# Patient Record
Sex: Female | Born: 1960
Health system: Southern US, Community
[De-identification: ages and names within clinical notes are randomized; demographics above are authoritative.]

## PROBLEM LIST (undated history)

## (undated) DIAGNOSIS — Z5181 Encounter for therapeutic drug level monitoring: Secondary | ICD-10-CM

## (undated) DIAGNOSIS — G43909 Migraine, unspecified, not intractable, without status migrainosus: Secondary | ICD-10-CM

## (undated) DIAGNOSIS — B0229 Other postherpetic nervous system involvement: Secondary | ICD-10-CM

## (undated) DIAGNOSIS — R002 Palpitations: Secondary | ICD-10-CM

## (undated) DIAGNOSIS — H9192 Unspecified hearing loss, left ear: Secondary | ICD-10-CM

## (undated) DIAGNOSIS — I63441 Cerebral infarction due to embolism of right cerebellar artery: Secondary | ICD-10-CM

## (undated) DIAGNOSIS — R0609 Other forms of dyspnea: Secondary | ICD-10-CM

## (undated) DIAGNOSIS — G43109 Migraine with aura, not intractable, without status migrainosus: Secondary | ICD-10-CM

## (undated) DIAGNOSIS — R42 Dizziness and giddiness: Secondary | ICD-10-CM

## (undated) DIAGNOSIS — R0789 Other chest pain: Principal | ICD-10-CM

## (undated) DIAGNOSIS — D6861 Antiphospholipid syndrome: Secondary | ICD-10-CM

## (undated) DIAGNOSIS — Z7409 Other reduced mobility: Secondary | ICD-10-CM

## (undated) DIAGNOSIS — Z7901 Long term (current) use of anticoagulants: Secondary | ICD-10-CM

## (undated) HISTORY — DX: Migraine with aura, not intractable, without status migrainosus: G43.109

## (undated) HISTORY — PX: COLPOSCOPY: SHX161

## (undated) HISTORY — DX: Dizziness and giddiness: R42

## (undated) HISTORY — PX: TONSILLECTOMY: SUR1361

## (undated) HISTORY — DX: Antiphospholipid syndrome: D68.61

## (undated) HISTORY — DX: Other reduced mobility: Z74.09

## (undated) HISTORY — DX: Migraine, unspecified, not intractable, without status migrainosus: G43.909

## (undated) HISTORY — DX: Cerebral infarction due to embolism of right cerebellar artery: I63.441

## (undated) HISTORY — DX: Encounter for therapeutic drug level monitoring: Z51.81

## (undated) HISTORY — DX: Unspecified hearing loss, left ear: H91.92

## (undated) HISTORY — DX: Long term (current) use of anticoagulants: Z79.01

## (undated) HISTORY — DX: Other forms of dyspnea: R06.09

## (undated) HISTORY — DX: Palpitations: R00.2

## (undated) HISTORY — DX: Other chest pain: R07.89

## (undated) HISTORY — DX: Other postherpetic nervous system involvement: B02.29

---

## 2014-10-26 ENCOUNTER — Ambulatory Visit: Payer: Self-pay | Admitting: Cardiology

## 2014-10-28 ENCOUNTER — Telehealth: Payer: Self-pay | Admitting: Cardiology

## 2014-10-28 ENCOUNTER — Encounter: Payer: Self-pay | Admitting: Cardiology

## 2014-11-17 NOTE — Telephone Encounter (Signed)
ERROR

## 2014-11-23 ENCOUNTER — Encounter: Payer: Self-pay | Admitting: *Deleted

## 2014-11-25 ENCOUNTER — Encounter: Payer: Self-pay | Admitting: Cardiology

## 2014-11-25 ENCOUNTER — Ambulatory Visit (INDEPENDENT_AMBULATORY_CARE_PROVIDER_SITE_OTHER): Payer: 59 | Admitting: Cardiology

## 2014-11-25 VITALS — BP 124/82 | HR 80 | Ht 65.0 in | Wt 112.8 lb

## 2014-11-25 DIAGNOSIS — R0609 Other forms of dyspnea: Secondary | ICD-10-CM

## 2014-11-25 DIAGNOSIS — R0600 Dyspnea, unspecified: Secondary | ICD-10-CM

## 2014-11-25 DIAGNOSIS — R079 Chest pain, unspecified: Secondary | ICD-10-CM | POA: Insufficient documentation

## 2014-11-25 DIAGNOSIS — R002 Palpitations: Secondary | ICD-10-CM

## 2014-11-25 DIAGNOSIS — R0789 Other chest pain: Secondary | ICD-10-CM

## 2014-11-25 HISTORY — DX: Other forms of dyspnea: R06.09

## 2014-11-25 HISTORY — DX: Dyspnea, unspecified: R06.00

## 2014-11-25 HISTORY — DX: Palpitations: R00.2

## 2014-11-25 HISTORY — DX: Chest pain, unspecified: R07.9

## 2014-11-25 NOTE — Patient Instructions (Signed)
Your physician has recommended that you wear an event monitor. Event monitors are medical devices that record the heart's electrical activity. Doctors most often us these monitors to diagnose arrhythmias. Arrhythmias are problems with the speed or rhythm of the heartbeat. The monitor is a small, portable device. You can wear one while you do your normal daily activities. This is usually used to diagnose what is causing palpitations/syncope (passing out).  Your physician has requested that you have an echocardiogram. Echocardiography is a painless test that uses sound waves to create images of your heart. It provides your doctor with information about the size and shape of your heart and how well your heart's chambers and valves are working. This procedure takes approximately one hour. There are no restrictions for this procedure Your physician has requested that you have a stress echocardiogram. For further information please visit [email or web address]. Please follow instruction sheet as given.  Your physician recommends that you continue on your current medications as directed. Please refer to the Current Medication list given to you today.  Follow up as needed

## 2014-11-25 NOTE — Progress Notes (Signed)
Cardiology Office Note   Date:  11/25/2014   ID:  Martha Burke, DOB 25-Jan-1961, MRN [id number]  PCP:  Frederich Chick, MD  Cardiologist:    Cassell Clement, MD   No chief complaint on file.     History of Present Illness: Martha Burke is a 54 y.o. female who presents for evaluation of intermittent palpitations, chest discomfort, and shortness of breath.  Seen at the request of Dr. Shirlean Mylar.  The patient does not have any history of known cardiac disease.  Patient had an echocardiogram in 2000 when she was pregnant and living in [place].  That time she had mildly enlarged right heart chambers and normal left ventricular function and mild mitral insufficiency and mild-to-moderate tricuspid regurgitation. For the past year she has been experiencing some palpitations.  Typically these occur in the evening.  They do not occur every night.  They often occur when she lies down to go to bed at night.  She describes them as a regular rhythm with intermittent premature beats.  They frequently make her feel like she needs to cough.  She has cut back on her caffeine without much effect.  Does not smoke.  She drinks very little alcohol. In addition, she has had some occasional left-sided chest discomfort and a localized area in the left upper anterior chest with exertion.  Discomfort subsides if she stops and presses on the area with the palm of her hand.  He has also noted some shortness of breath with activity such as climbing stairs. The patient has a family history of premature coronary disease.  Her father died at age 50.  He had a heart attack at age 60 which occurred during a cardiac catheterization.  Her father had CABG at age 31.  The patient has a younger brother with hypertension.  His mother is alive at 21 and required cardioversion for an irregular heartbeat last year. Social history reveals that the patient is married.  She has 4 children.  Youngest child is a juvenile  diabetic and the patient herself has gone on a nearly vegan diet to help her daughter control her diabetes.  Patient works as an Retail buyer at [employer].  She had homeschooled her previous 3 children and her youngest child is now a Consulting civil engineer in high school at [employer].    Past Medical History  Diagnosis Date  . Palpitations   . Migraine headache     Past Surgical History  Procedure Laterality Date  . Tonsillectomy       Current Outpatient Prescriptions  Medication Sig Dispense Refill  . acetaminophen (TYLENOL) 500 MG tablet Take 500 mg by mouth every 6 (six) hours as needed.    Marland Kitchen ibuprofen (ADVIL,MOTRIN) 200 MG tablet Take 200 mg by mouth every 6 (six) hours as needed.     No current facility-administered medications for this visit.    Allergies:   Review of patient's allergies indicates no known allergies.    Social History:  The patient  reports that she has never smoked. She does not have any smokeless tobacco history on file.   Family History:  The patient's family history includes Atrial fibrillation in her mother; Diabetes in her daughter; Heart attack in her father; Hypertension in her father.    ROS:  Please see the history of present illness.   Otherwise, review of systems are positive for none.   All other systems are reviewed and negative.    PHYSICAL EXAM:  VS:  BP 124/82 mmHg  Pulse 80  Ht 5\' 5"  (1.651 m)  Wt 112 lb 12.8 oz (51.166 kg)  BMI 18.77 kg/m2 , BMI Body mass index is 18.77 kg/(m^2). GEN: Well nourished, well developed, in no acute distress HEENT: normal Neck: no JVD, carotid bruits, or masses Cardiac: RRR; no murmurs, rubs, or gallops,no edema  Respiratory:  clear to auscultation bilaterally, normal work of breathing GI: soft, nontender, nondistended, + BS MS: no deformity or atrophy Skin: warm and dry, no rash Neuro:  Strength and sensation are intact Psych: euthymic mood, full affect   EKG:  EKG is ordered today. The ekg  ordered today demonstrates normal sinus rhythm and no ischemic changes.  No premature beats are seen.  Tracing was compared with the previous EKG of 10/19/14 and is unchanged   Recent Labs: No results found for requested labs within last 365 days.    Lipid Panel No results found for: CHOL, TRIG, HDL, CHOLHDL, VLDL, LDLCALC, LDLDIRECT    Wt Readings from Last 3 Encounters:  11/25/14 112 lb 12.8 oz (51.166 kg)      Other studies Reviewed: Additional studies/ records that were reviewed today include: Lab work sent over from Dr. Hyman HopesWebb. Review of the above records demonstrates: Normal electrolytes and blood sugar.   ASSESSMENT AND PLAN:  1.  Intermittent palpitations, increasing in frequency, occurring mainly in the evening 2.  Atypical chest pain 3.  Dyspnea on exertion   Current medicines are reviewed at length with the patient today.  The patient does not have concerns regarding medicines.  Patient is not currently on any cardiac medications  The following changes have been made:  no change  Labs/ tests ordered today include:  Orders Placed This Encounter  Procedures  . EKG 12-Lead  . Cardiac event monitor  . 2D Echocardiogram without contrast  . Echocardiogram stress test without contrast   Plan: We will obtain a two-dimensional echocardiogram to look at her heart valves.  We will obtain a stress echocardiogram to evaluate her exertional chest discomfort.  To evaluate her intermittent palpitations we will have her wear an event monitor for 30 days.  No new medications prescribed at this point.  She should continue to limit caffeine. Any thanks for the opportunity to see this pleasant woman with you and we will be in touch regarding the results of her studies.  Recheck here on a when necessary basis.    Signed, Cassell Clementhomas Jaelynn Pozo, MD  11/25/2014 5:20 PM    [facility] [address], [place], [place]  [address] Phone: [phone]) [phone]; Fax: [phone]

## 2014-11-26 ENCOUNTER — Encounter: Payer: Self-pay | Admitting: *Deleted

## 2014-11-26 ENCOUNTER — Encounter (INDEPENDENT_AMBULATORY_CARE_PROVIDER_SITE_OTHER): Payer: 59

## 2014-11-26 DIAGNOSIS — R0609 Other forms of dyspnea: Secondary | ICD-10-CM

## 2014-11-26 DIAGNOSIS — R0600 Dyspnea, unspecified: Secondary | ICD-10-CM

## 2014-11-26 DIAGNOSIS — R002 Palpitations: Secondary | ICD-10-CM | POA: Diagnosis not present

## 2014-11-26 NOTE — Progress Notes (Signed)
Patient ID: Jolly MangoMarsha Burke, female   DOB: Nov 10, 1960, 54 y.o.   MRN: [id number] [employer] 30 day cardiac event monitor applied to patient.

## 2014-12-04 ENCOUNTER — Ambulatory Visit (HOSPITAL_COMMUNITY): Payer: 59 | Attending: Cardiology

## 2014-12-04 ENCOUNTER — Ambulatory Visit (HOSPITAL_BASED_OUTPATIENT_CLINIC_OR_DEPARTMENT_OTHER): Payer: 59 | Admitting: Cardiology

## 2014-12-04 DIAGNOSIS — R0600 Dyspnea, unspecified: Secondary | ICD-10-CM

## 2014-12-04 DIAGNOSIS — R0789 Other chest pain: Secondary | ICD-10-CM | POA: Insufficient documentation

## 2014-12-04 DIAGNOSIS — R0609 Other forms of dyspnea: Secondary | ICD-10-CM

## 2014-12-04 DIAGNOSIS — R079 Chest pain, unspecified: Secondary | ICD-10-CM

## 2014-12-04 DIAGNOSIS — R002 Palpitations: Secondary | ICD-10-CM | POA: Insufficient documentation

## 2014-12-04 NOTE — Progress Notes (Signed)
Stress echo performed. 

## 2014-12-04 NOTE — Progress Notes (Signed)
Echo performed. 

## 2014-12-07 ENCOUNTER — Telehealth: Payer: Self-pay | Admitting: Cardiology

## 2014-12-07 NOTE — Telephone Encounter (Signed)
-----   Message from Cassell Clementhomas Brackbill, MD sent at 12/06/2014  5:39 PM EDT ----- Please report. Stress echo was normal.  No blockage. Good LV function.

## 2014-12-07 NOTE — Telephone Encounter (Signed)
New Msg        Pt states she is returning call from today.    Please return call, its ok to leave detailed msgs.   Pt will be unavailable for the next 2 hrs.

## 2014-12-07 NOTE — Telephone Encounter (Signed)
Left message to call back  

## 2014-12-11 NOTE — Telephone Encounter (Signed)
Advised patient

## 2014-12-11 NOTE — Telephone Encounter (Signed)
-----   Message from Cassell Clementhomas Brackbill, MD sent at 12/08/2014  8:47 PM EDT ----- Please report.  The echo showed normal LV function. There was mild mitral regurgitation, not significant or causing any symptoms. Continue full activity.

## 2015-01-01 ENCOUNTER — Telehealth: Payer: Self-pay | Admitting: *Deleted

## 2015-01-01 NOTE — Telephone Encounter (Signed)
Left message to call back    Dr. Patty SermonsBrackbill reviewed event monitor  Interpretation: NSR, occasional PAC's. No SVT, atrial fibrillation, or dangerous arrhythmia

## 2015-01-06 NOTE — Telephone Encounter (Signed)
Called patient about event monitor results. Per Dr. Lucienne MinksBracbill, NSR, occasional PAC's. NO SVT, artrial fibrillation, or dangerous arrhythmia. Patient verbalized understanding.

## 2016-12-26 ENCOUNTER — Other Ambulatory Visit: Payer: Self-pay | Admitting: *Deleted

## 2016-12-26 ENCOUNTER — Other Ambulatory Visit: Payer: Self-pay | Admitting: Family Medicine

## 2016-12-26 ENCOUNTER — Ambulatory Visit
Admission: RE | Admit: 2016-12-26 | Discharge: 2016-12-26 | Disposition: A | Discharge status: Home / Self Care | Payer: 59 | Source: Ambulatory Visit | Attending: Family Medicine | Admitting: Family Medicine

## 2016-12-26 DIAGNOSIS — R05 Cough: Secondary | ICD-10-CM

## 2016-12-26 DIAGNOSIS — R059 Cough, unspecified: Secondary | ICD-10-CM

## 2017-03-27 DIAGNOSIS — R42 Dizziness and giddiness: Secondary | ICD-10-CM | POA: Insufficient documentation

## 2017-03-27 HISTORY — DX: Dizziness and giddiness: R42

## 2017-03-28 DIAGNOSIS — I63441 Cerebral infarction due to embolism of right cerebellar artery: Secondary | ICD-10-CM | POA: Insufficient documentation

## 2017-03-28 HISTORY — DX: Cerebral infarction due to embolism of right cerebellar artery: I63.441

## 2017-04-02 DIAGNOSIS — Z789 Other specified health status: Secondary | ICD-10-CM | POA: Insufficient documentation

## 2017-04-02 DIAGNOSIS — Z7409 Other reduced mobility: Secondary | ICD-10-CM | POA: Insufficient documentation

## 2017-04-02 HISTORY — DX: Other reduced mobility: Z74.09

## 2017-04-02 HISTORY — DX: Other specified health status: Z78.9

## 2017-04-09 DIAGNOSIS — D6861 Antiphospholipid syndrome: Secondary | ICD-10-CM

## 2017-04-09 DIAGNOSIS — Z7901 Long term (current) use of anticoagulants: Secondary | ICD-10-CM

## 2017-04-09 DIAGNOSIS — Z5181 Encounter for therapeutic drug level monitoring: Secondary | ICD-10-CM

## 2017-04-09 HISTORY — DX: Antiphospholipid syndrome: D68.61

## 2017-04-09 HISTORY — DX: Encounter for therapeutic drug level monitoring: Z51.81

## 2017-04-09 HISTORY — DX: Long term (current) use of anticoagulants: Z79.01

## 2017-04-12 ENCOUNTER — Telehealth: Payer: Self-pay | Admitting: *Deleted

## 2017-04-12 ENCOUNTER — Encounter: Payer: Self-pay | Admitting: Neurology

## 2017-04-12 ENCOUNTER — Encounter: Payer: Self-pay | Admitting: *Deleted

## 2017-04-12 DIAGNOSIS — R079 Chest pain, unspecified: Secondary | ICD-10-CM

## 2017-04-12 DIAGNOSIS — R0789 Other chest pain: Principal | ICD-10-CM

## 2017-04-12 NOTE — Telephone Encounter (Signed)
Referral request from Shirlean Mylarcarol Webb, MD of [facility] sent to scheduling

## 2017-04-13 ENCOUNTER — Telehealth: Payer: Self-pay | Admitting: Physician Assistant

## 2017-04-13 ENCOUNTER — Ambulatory Visit: Payer: Commercial Managed Care - PPO | Admitting: Cardiology

## 2017-05-02 ENCOUNTER — Ambulatory Visit (INDEPENDENT_AMBULATORY_CARE_PROVIDER_SITE_OTHER): Payer: Commercial Managed Care - PPO | Admitting: Physician Assistant

## 2017-05-02 ENCOUNTER — Encounter: Payer: Self-pay | Admitting: Physician Assistant

## 2017-05-02 VITALS — BP 137/87 | HR 64 | Ht 65.0 in | Wt 117.6 lb

## 2017-05-02 DIAGNOSIS — R002 Palpitations: Secondary | ICD-10-CM

## 2017-05-02 DIAGNOSIS — I491 Atrial premature depolarization: Secondary | ICD-10-CM

## 2017-05-02 DIAGNOSIS — R760 Raised antibody titer: Secondary | ICD-10-CM

## 2017-05-02 DIAGNOSIS — I6340 Cerebral infarction due to embolism of unspecified cerebral artery: Secondary | ICD-10-CM

## 2017-05-02 NOTE — Progress Notes (Signed)
Cardiology Office Note    Date:  05/03/2017   ID:  Martha MangoMarsha Burke, DOB November 14, 1960, MRN [id number]  PCP:  Shirlean MylarWebb, Carol, MD  Cardiologist:  Previously Dr. Patty SermonsBrackbill, plan to setup with Dr. Duke Salviaandolph Hematologist: Dr. Ivor ReiningGeorge Sanders Neurologist Dr. Quinn PlowmanAmy Kearns  Chief Complaint  Patient presents with  . Hospitalization Follow-up    stroke, pt denied chest pain and SOB. Plan to set up with Dr. Duke Salviaandolph    History of Present Illness:  Martha Burke is a 56 y.o. female with PMH of intermittent palpitation, Chest discomfort or shortness breath he was previously seen by Dr. Patty SermonsBrackbill on 11/07/2014. She had a previous echocardiogram in 2000 which showed mild-to-moderate tricuspid regurgitation, normal LV function and mildly enlarged right heart chamber. She has significant family history of coronary artery disease with her father had CABG at age 55. Last echocardiogram obtained on 12/04/2014 showed EF 60-65%, mild MR. She also had a stress echocardiogram on 12/04/2014 that was normal. She were a 30 day day monitor at the time, this showed normal sinus rhythm with PACs but no significant arrhythmia or atrial fibrillation.  She was recently treated for pharyngitis, later developed shingles. She presented to the hospital on 03/27/2017 with leg weakness, imbalance, slurring of speech and intractable vomiting. CT of the head was negative, MRI of the brain however showed subacute infarct in the right superior cerebellum, small amount of acute infarct in the cerebellar vermis and left superior cerebellum, probable embolic infarct. Her aspirin was discontinued and she was switched to eliquis and eventually Coumadin bridging with Lovenox. INR goal was 2.5-3 according to hematologist. She had echocardiogram which was normal. Negative bubble study. ANA-positive titer 1:160, antiphospholipid antibody positive. Hemoglobin A1c was 5.2. According to hematologist, positive antiphospholipid antibodies could be secondary to  recent herpes infection. Cardiology was consulted who recommended follow-up as outpatient if she developed palpitations to rule out paroxysmal atrial fibrillation.  Since her discharge, she has not noticed any significant recurrent palpitation. She however has been noticing upper left arm pain which is localized. It is worse when she is laying on her side, however not affected by palpation and arm rotation. She has very good radial pulse on that side. She did not have any recent invasive catheter placed on the arm. At this point it is very atypical and I would not recommend any additional workup. I recommended she continue observation in this time. As far as her recent stroke, although we can potentially consider heart monitor, however since she is already on the Coumadin, it will not change her overall treatment plan. [facility]. She does still have some degree of imbalance issue. I did a full neuro exam on her, does not appears to have obvious focal deficits.    Past Medical History:  Diagnosis Date  . APS (antiphospholipid syndrome) (HCC) 04/09/2017  . Cerebrovascular accident (CVA) due to embolism of right cerebellar artery (HCC) 03/28/2017  . Chest pain of unknown etiology 11/25/2014  . Decreased hearing, left   . Dyspnea on exertion 11/25/2014  . Encounter for therapeutic drug monitoring 04/09/2017  . Impaired mobility and activities of daily living 04/02/2017  . Intermittent palpitations 11/25/2014  . Long term (current) use of anticoagulants 04/09/2017  . Migraine aura without headache   . Migraine headache   . Palpitations   . Palpitations   . Post herpetic neuralgia   . Vertigo 03/27/2017    Past Surgical History:  Procedure Laterality Date  .  TONSILLECTOMY      Current Medications: Outpatient Medications Prior to Visit  Medication Sig Dispense Refill  . acetaminophen (TYLENOL) 500 MG tablet Take 500 mg by mouth every 6 (six) hours as needed.      Marland Kitchen ibuprofen (ADVIL,MOTRIN) 200 MG tablet Take 200 mg by mouth every 6 (six) hours as needed.     No facility-administered medications prior to visit.      Allergies:   Celery oil; Peanut oil; and Tomato   Social History   Social History  . Marital status: Married    Spouse name: N/A  . Number of children: N/A  . Years of education: N/A   Social History Main Topics  . Smoking status: Never Smoker  . Smokeless tobacco: Never Used  . Alcohol use No  . Drug use: No  . Sexual activity: Not Asked   Other Topics Concern  . None   Social History Narrative  . None     Family History:  The patient's family history includes Atrial fibrillation in her mother; CAD in her father; Diabetes in her daughter; Heart attack in her paternal grandfather and paternal grandmother; Heart attack (age of onset: 41) in her father; Hypertension in her brother, brother, and father; Irregular heart beat in her mother; Stroke in her maternal grandmother.   ROS:   Please see the history of present illness.    ROS All other systems reviewed and are negative.   PHYSICAL EXAM:   VS:  BP 137/87   Pulse 64   Ht 5\' 5"  (1.651 m)   Wt 117 lb 9.6 oz (53.3 kg)   BMI 19.57 kg/m    GEN: Well nourished, well developed, in no acute distress  HEENT: normal  Neck: no JVD, carotid bruits, or masses Cardiac: RRR; no murmurs, rubs, or gallops,no edema  Respiratory:  clear to auscultation bilaterally, normal work of breathing GI: soft, nontender, nondistended, + BS MS: no deformity or atrophy  Skin: warm and dry, no rash Neuro:  Alert and Oriented x 3, Strength and sensation are intact Psych: euthymic mood, full affect  Wt Readings from Last 3 Encounters:  05/02/17 117 lb 9.6 oz (53.3 kg)  11/25/14 112 lb 12.8 oz (51.2 kg)      Studies/Labs Reviewed:   EKG:  EKG is ordered today.  The ekg ordered today demonstrates Normal sinus rhythm, poor R wave progression in anterior leads  Recent Labs: No results  found for requested labs within last 8760 hours.   Lipid Panel No results found for: CHOL, TRIG, HDL, CHOLHDL, VLDL, LDLCALC, LDLDIRECT  Additional studies/ records that were reviewed today include:   Echo 12/04/2014 LV EF: 60% -  65%  ------------------------------------------------------------------- Indications:   (R06.09).  ------------------------------------------------------------------- History:  PMH: Palpitations. Acquired from the patient and from the patient&'s chart. Chest pain. Dyspnea.  ------------------------------------------------------------------- Study Conclusions  - Left ventricle: The cavity size was normal. Wall thickness was normal. Systolic function was normal. The estimated ejection fraction was in the range of 60% to 65%. Wall motion was normal; there were no regional wall motion abnormalities. Left ventricular diastolic function parameters were normal. - Mitral valve: There was mild regurgitation.   Stress echo 12/04/2014 Study Conclusions  - Stress ECG conclusions: There were no stress arrhythmias or conduction abnormalities. The stress ECG was negative for ischemia. - Staged echo: There was no echocardiographic evidence for stress-induced ischemia.  Impressions:  - Normal stress echo     ASSESSMENT:    1. Cerebrovascular accident (CVA)  due to embolism of cerebral artery (HCC)   2. Palpitation   3. Antiphospholipid antibody positive   4. PAC (premature atrial contraction)      PLAN:  In order of problems listed above:  1. CVA: Recent CVA, no focal deficits on physical exam. Antiphospholipid antibody positive.  2. Palpitation: No obvious evidence of atrial fibrillation. She is currently on Coumadin after recent stroke, a heart monitor would not change her treatment plan.  3. Antiphospholipid antibody positive: could be related to recent infection    Medication Adjustments/Labs and Tests Ordered: Current  medicines are reviewed at length with the patient today.  Concerns regarding medicines are outlined above.  Medication changes, Labs and Tests ordered today are listed in the Patient Instructions below. Patient Instructions  Medication Instructions:   No change  Labwork:   none  Testing/Procedures:  none  Follow-Up:  With Dr. Duke Salvia in 3-4 months  If your arm pain becomes more exertional in nature, please let us know, and we will see you sooner than scheduled.   If you need a refill on your cardiac medications before your next appointment, please call your pharmacy.      Ramond Dial, [place]  05/03/2017 11:58 PM    [facility] [address] [place], [place], [place]  [address] Phone: [phone]; Fax: [phone]

## 2017-05-02 NOTE — Patient Instructions (Signed)
Medication Instructions:   No change  Labwork:   none  Testing/Procedures:  none  Follow-Up:  With Dr. Duke Salviaandolph in 3-4 months  If your arm pain becomes more exertional in nature, please let us know, and we will see you sooner than scheduled.   If you need a refill on your cardiac medications before your next appointment, please call your pharmacy.

## 2017-05-03 ENCOUNTER — Encounter: Payer: Self-pay | Admitting: Physician Assistant

## 2017-07-05 ENCOUNTER — Ambulatory Visit (INDEPENDENT_AMBULATORY_CARE_PROVIDER_SITE_OTHER): Payer: BLUE CROSS/BLUE SHIELD | Admitting: Neurology

## 2017-07-05 ENCOUNTER — Encounter: Payer: Self-pay | Admitting: Neurology

## 2017-07-05 VITALS — BP 118/94 | HR 63 | Ht 65.0 in | Wt 118.4 lb

## 2017-07-05 DIAGNOSIS — B0229 Other postherpetic nervous system involvement: Secondary | ICD-10-CM

## 2017-07-05 DIAGNOSIS — D6861 Antiphospholipid syndrome: Secondary | ICD-10-CM

## 2017-07-05 DIAGNOSIS — I6349 Cerebral infarction due to embolism of other cerebral artery: Secondary | ICD-10-CM | POA: Diagnosis not present

## 2017-07-05 NOTE — Patient Instructions (Signed)
1.  Continue Coumadin.  If there is any discussion about stopping Coumadin, then I would like to get a 30 day cardiac event monitor 2.  Continue pravastatin 3.  No limitations 4.  Follow up in 3 months.

## 2017-07-05 NOTE — Progress Notes (Signed)
NEUROLOGY CONSULTATION NOTE  Martha Burke MRN: [id number] DOB: 06/04/1961  Referring provider: Dr. Hyman Hopes Primary care provider: Dr. Hyman Hopes  Reason for consult:  stroke  HISTORY OF PRESENT ILLNESS: Martha Burke is a 56 year old right-handed female who presents for stroke.  History supplemented by hospital admission records.  CT and MRI reports from admission reviewed.  In May 2018, she developed zoster involving the left side/posterior neck and with dysesthesia in the upper arm/bicep.  She was treated with Valtrex and steroids.  She was admitted to [facility] from 03/27/17 to 04/02/17 for stroke.  She presented with disequilibrium, ataxia, right sided dysmetria and left sided weakness.  CT of head was negative but MRI of brain showed subacute infarct in the right superior cerebellum as well as small acute infarct in the cerebellar vermis and left superior cerebellum.  As stroke appeared embolic, she was started on Eliquis for possible paroxysmal atrial fibrillation.  CTA of the head showed normal variant of the circle of Willis with no significant proximal stenosis or branch vessel occlusion.  CTA of neck showed normal variant occluded and aberrant right subclavian artery and fetal type right posterior cerebral artery but no significant focal stenosis or occlusion of the carotid or vertebral arteries.  TTE demonstrated negative bubble study with normal left ventricular size and systolic function with no appreciable segmental abnormality.  EKG was within normal limits.  Cardiology recommended outpatient workup only if she developed palpitations.  ANA was positive with titer 1:160 and antiphospholipid antibodies were positive.  She was also found to have elevated beta-2 glycoprotein IgG of 104 and anticardiolipin IgG of 56.  Hematology was consulted and stated that the findings could be secondary to the herpes infection in May.  She was diagnosed with embolic stroke secondary to  antiphospholipid antibody syndrome and was switched from Eliquis to Coumadin with Lovenox bridge.  Hgb A1c was 5.2.  She was also started on pravastatin 80mg .   Since the stroke, she feels more emotional.  She still has burning sensation in the left biceps.  PAST MEDICAL HISTORY: Past Medical History:  Diagnosis Date  . APS (antiphospholipid syndrome) (HCC) 04/09/2017  . Cerebrovascular accident (CVA) due to embolism of right cerebellar artery (HCC) 03/28/2017  . Chest pain of unknown etiology 11/25/2014  . Decreased hearing, left   . Dyspnea on exertion 11/25/2014  . Encounter for therapeutic drug monitoring 04/09/2017  . Impaired mobility and activities of daily living 04/02/2017  . Intermittent palpitations 11/25/2014  . Long term (current) use of anticoagulants 04/09/2017  . Migraine aura without headache   . Migraine headache   . Palpitations   . Palpitations   . Post herpetic neuralgia   . Vertigo 03/27/2017    PAST SURGICAL HISTORY: Past Surgical History:  Procedure Laterality Date  . TONSILLECTOMY      MEDICATIONS: Current Outpatient Prescriptions on File Prior to Visit  Medication Sig Dispense Refill  . acetaminophen (TYLENOL) 500 MG tablet Take 500 mg by mouth every 6 (six) hours as needed.    Marland Kitchen ibuprofen (ADVIL,MOTRIN) 200 MG tablet Take 200 mg by mouth every 6 (six) hours as needed.    . pravastatin (PRAVACHOL) 80 MG tablet Take 80 mg by mouth daily.    Marland Kitchen warfarin (COUMADIN) 5 MG tablet Take 5 mg by mouth. As directed by coumadin clinic     No current facility-administered medications on file prior to visit.     ALLERGIES: Allergies  Allergen Reactions  . Celery  Oil     Other reaction(s): Other (See Comments) Stomach issues  welps  . Peanut Oil     Other reaction(s): Other (See Comments) Stomach issues  welps  . Tomato     Other reaction(s): Other (See Comments) Stomach issues  welps  h/a    FAMILY HISTORY: Family History  Problem Relation Age of Onset   . Heart attack Father 30       CABG 50  . Hypertension Father   . CAD Father   . Atrial fibrillation Mother   . Irregular heart beat Mother   . Hypertension Brother   . Hypertension Brother   . Diabetes Daughter   . Stroke Maternal Grandmother   . Heart attack Paternal Grandmother   . Heart attack Paternal Grandfather     SOCIAL HISTORY: Social History   Social History  . Marital status: Married    Spouse name: N/A  . Number of children: N/A  . Years of education: N/A   Occupational History  . Not on file.   Social History Main Topics  . Smoking status: Never Smoker  . Smokeless tobacco: Never Used  . Alcohol use No  . Drug use: No  . Sexual activity: Not on file   Other Topics Concern  . Not on file   Social History Narrative  . No narrative on file    REVIEW OF SYSTEMS: Constitutional: No fevers, chills, or sweats, no generalized fatigue, change in appetite Eyes: No visual changes, double vision, eye pain Ear, nose and throat: No hearing loss, ear pain, nasal congestion, sore throat Cardiovascular: No chest pain, palpitations Respiratory:  No shortness of breath at rest or with exertion, wheezes GastrointestinaI: No nausea, vomiting, diarrhea, abdominal pain, fecal incontinence Genitourinary:  No dysuria, urinary retention or frequency Musculoskeletal:  No neck pain, back pain Integumentary: No rash, pruritus, skin lesions Neurological: as above Psychiatric: No depression, insomnia, anxiety Endocrine: No palpitations, fatigue, diaphoresis, mood swings, change in appetite, change in weight, increased thirst Hematologic/Lymphatic:  No purpura, petechiae. Allergic/Immunologic: no itchy/runny eyes, nasal congestion, recent allergic reactions, rashes  PHYSICAL EXAM: Vitals:   07/05/17 1306  BP: (!) 118/94  Pulse: 63  SpO2: 99%   General: No acute distress.  Patient appears well-groomed. Head:  Normocephalic/atraumatic Eyes:  fundi examined but not  visualized Neck: supple, no paraspinal tenderness, full range of motion Back: No paraspinal tenderness Heart: regular rate and rhythm Lungs: Clear to auscultation bilaterally. Vascular: No carotid bruits. Neurological Exam: Mental status: alert and oriented to person, place, and time, recent and remote memory intact, fund of knowledge intact, attention and concentration intact, speech fluent and not dysarthric, language intact. Cranial nerves: CN I: not tested CN II: pupils equal, round and reactive to light, visual fields intact CN III, IV, VI:  full range of motion, no nystagmus, no ptosis CN V: facial sensation intact CN VII: upper and lower face symmetric CN VIII: hearing intact CN IX, X: gag intact, uvula midline CN XI: sternocleidomastoid and trapezius muscles intact CN XII: tongue midline Bulk & Tone: normal, no fasciculations. Motor:  5/5 throughout  Sensation:  Pinprick and vibration sensation intact. Deep Tendon Reflexes:  2+ throughout, toes downgoing.  Finger to nose testing:  Without dysmetria.  Heel to shin:  Without dysmetria.  Gait:  Normal station and stride.  Able to turn and tandem walk. Romberg negative.  IMPRESSION: 1.  Embolic cerebellar stroke, bilateral.  At this point, working diagnosis is secondary to antiphospholipid antibody syndrome. 2.  Antiphospholipid antibody syndrome.  She has had hypercoagulable panel checked in the past for her pregnancies and was never positive.  Therefore, it is suspected to be reactionary to herpes zoster. 3.  Post-herpetic neuralgia involving left upper extremity.  Not severe.  PLAN: 1.  Continue Coumadin.  She follows up with hematology.  If repeat testing for antiphospholipid antibody is negative, she may be taken off Coumadin.  If that is the case, then I would want to order a 30 day cardiac event monitor to rule out underlying paroxysmal atrial fibrillation, which would indicate remaining on anticoagulation. 2.  Continue  pravastatin 80mg  daily for secondary stroke prevention. 3.  Follow up in 3 months.  Thank you for allowing me to take part in the care of this patient.  Shon MilletAdam Jaffe, DO  CC: Shirlean Mylararol Webb, MD

## 2017-07-26 ENCOUNTER — Ambulatory Visit: Payer: Commercial Managed Care - PPO | Admitting: Neurology

## 2017-10-24 ENCOUNTER — Encounter: Payer: Self-pay | Admitting: Neurology

## 2017-10-24 ENCOUNTER — Ambulatory Visit (INDEPENDENT_AMBULATORY_CARE_PROVIDER_SITE_OTHER): Payer: BLUE CROSS/BLUE SHIELD | Admitting: Neurology

## 2017-10-24 VITALS — BP 122/94 | HR 71 | Ht 66.0 in | Wt 120.8 lb

## 2017-10-24 DIAGNOSIS — I6349 Cerebral infarction due to embolism of other cerebral artery: Secondary | ICD-10-CM | POA: Diagnosis not present

## 2017-10-24 DIAGNOSIS — D6861 Antiphospholipid syndrome: Secondary | ICD-10-CM | POA: Diagnosis not present

## 2017-10-24 DIAGNOSIS — B0229 Other postherpetic nervous system involvement: Secondary | ICD-10-CM

## 2017-10-24 DIAGNOSIS — M542 Cervicalgia: Secondary | ICD-10-CM

## 2017-10-24 NOTE — Patient Instructions (Addendum)
1.  Continue Coumadin and pravastatin 2.  We will refer you to physical therapy for neck pain 3.  Follow up after seeing hematology in May.

## 2017-10-24 NOTE — Progress Notes (Signed)
NEUROLOGY FOLLOW UP OFFICE NOTE  Martha Burke [id number]  HISTORY OF PRESENT ILLNESS: Martha Burke is a 57 year old right-handed female with antiphospholipid antibody syndrome who follows up for embolic cerebellar stroke and post-herpetic neuralgia.   UPDATE: She remains on Coumadin for antiphospholipid antibody syndrome and secondary stroke prevention.  She continues on pravastatin 80mg  daily.  There was question whether the positive hypercoagulable workup could have been reactionary to recent herpes infection.  She followed up with hematology who recommended remaining on Coumadin for now and will recheck labs in 3 months.  She continues to have paresthesias in the left biceps radiating down to the middle finger.  She notes increased bilateral neck pain radiating into the shoulders but no radicular pain down the left arm.  She feels that her right leg is a little weaker and unsteady.  She reports on 3 occasions having brief electric pain in her left temple, lasting just a couple of seconds.  No other symptoms.  She does have history of migraines, which are different.  HISTORY: In May 2018, she developed zoster involving the left side/posterior neck and with dysesthesia in the upper arm/bicep.  She was treated with Valtrex and steroids.  She was admitted to [facility] from 03/27/17 to 04/02/17 for stroke.  She presented with disequilibrium, ataxia, right sided dysmetria and left sided weakness.  CT of head was negative but MRI of brain showed subacute infarct in the right superior cerebellum as well as small acute infarct in the cerebellar vermis and left superior cerebellum.  As stroke appeared embolic, she was started on Eliquis for possible paroxysmal atrial fibrillation.  CTA of the head showed normal variant of the circle of Willis with no significant proximal stenosis or branch vessel occlusion.  CTA of neck showed normal variant occluded and aberrant right subclavian artery  and fetal type right posterior cerebral artery but no significant focal stenosis or occlusion of the carotid or vertebral arteries.  TTE demonstrated negative bubble study with normal left ventricular size and systolic function with no appreciable segmental abnormality.  EKG was within normal limits.  Cardiology recommended outpatient workup only if she developed palpitations.  ANA was positive with titer 1:160 and antiphospholipid antibodies were positive.  She was also found to have elevated beta-2 glycoprotein IgG of 104 and anticardiolipin IgG of 56.  Hematology was consulted and stated that the findings could be secondary to the herpes infection in May.  She was diagnosed with embolic stroke secondary to antiphospholipid antibody syndrome and was switched from Eliquis to Coumadin with Lovenox bridge.  Hgb A1c was 5.2.  She was also started on pravastatin 80mg .    Since the stroke, she feels more emotional.  She still has burning sensation in the left biceps. PAST MEDICAL HISTORY: Past Medical History:  Diagnosis Date  . APS (antiphospholipid syndrome) (HCC) 04/09/2017  . Cerebrovascular accident (CVA) due to embolism of right cerebellar artery (HCC) 03/28/2017  . Chest pain of unknown etiology 11/25/2014  . Decreased hearing, left   . Dyspnea on exertion 11/25/2014  . Encounter for therapeutic drug monitoring 04/09/2017  . Impaired mobility and activities of daily living 04/02/2017  . Intermittent palpitations 11/25/2014  . Long term (current) use of anticoagulants 04/09/2017  . Migraine aura without headache   . Migraine headache   . Palpitations   . Palpitations   . Post herpetic neuralgia   . Vertigo 03/27/2017    MEDICATIONS: Current Outpatient Medications on File Prior to Visit  Medication Sig Dispense Refill  . acetaminophen (TYLENOL) 500 MG tablet Take 500 mg by mouth every 6 (six) hours as needed.    . folic acid (FOLVITE) 1 MG tablet Take 1 mg by mouth daily.  3  . ibuprofen  (ADVIL,MOTRIN) 200 MG tablet Take 200 mg by mouth every 6 (six) hours as needed.    . pravastatin (PRAVACHOL) 80 MG tablet Take 80 mg by mouth daily.    Marland Kitchen. warfarin (COUMADIN) 5 MG tablet Take 5 mg by mouth. As directed by coumadin clinic     No current facility-administered medications on file prior to visit.     ALLERGIES: Allergies  Allergen Reactions  . Celery Oil     Other reaction(s): Other (See Comments) Stomach issues  welps  . Peanut Oil     Other reaction(s): Other (See Comments) Stomach issues  welps  . Tomato     Other reaction(s): Other (See Comments) Stomach issues  welps  h/a    FAMILY HISTORY: Family History  Problem Relation Age of Onset  . Heart attack Father 30       CABG 50  . Hypertension Father   . CAD Father   . Atrial fibrillation Mother   . Irregular heart beat Mother   . Hypertension Brother   . Hypertension Brother   . Diabetes Daughter   . Stroke Maternal Grandmother   . Heart attack Paternal Grandmother   . Heart attack Paternal Grandfather     SOCIAL HISTORY: Social History   Socioeconomic History  . Marital status: Married    Spouse name: Not on file  . Number of children: Not on file  . Years of education: Not on file  . Highest education level: Not on file  Social Needs  . Financial resource strain: Not on file  . Food insecurity - worry: Not on file  . Food insecurity - inability: Not on file  . Transportation needs - medical: Not on file  . Transportation needs - non-medical: Not on file  Occupational History  . Not on file  Tobacco Use  . Smoking status: Never Smoker  . Smokeless tobacco: Never Used  Substance and Sexual Activity  . Alcohol use: No    Alcohol/week: 0.0 oz  . Drug use: No  . Sexual activity: Not on file  Other Topics Concern  . Not on file  Social History Narrative  . Not on file    REVIEW OF SYSTEMS: Constitutional: No fevers, chills, or sweats, no generalized fatigue, change in  appetite Eyes: No visual changes, double vision, eye pain Ear, nose and throat: No hearing loss, ear pain, nasal congestion, sore throat Cardiovascular: No chest pain, palpitations Respiratory:  No shortness of breath at rest or with exertion, wheezes GastrointestinaI: No nausea, vomiting, diarrhea, abdominal pain, fecal incontinence Genitourinary:  No dysuria, urinary retention or frequency Musculoskeletal:  Neck pain Integumentary: No rash, pruritus, skin lesions Neurological: as above Psychiatric: No depression, insomnia, anxiety Endocrine: No palpitations, fatigue, diaphoresis, mood swings, change in appetite, change in weight, increased thirst Hematologic/Lymphatic:  No purpura, petechiae. Allergic/Immunologic: no itchy/runny eyes, nasal congestion, recent allergic reactions, rashes  PHYSICAL EXAM: Vitals:   10/24/17 0945  BP: (!) 122/94  Pulse: 71  SpO2: 98%   General: No acute distress.  Patient appears well-groomed.  normal body habitus. Head:  Normocephalic/atraumatic Eyes:  Fundi examined but not visualized Neck: supple, mild paraspinal tenderness, full range of motion Heart:  Regular rate and rhythm Lungs:  Clear to auscultation  bilaterally Back: No paraspinal tenderness Neurological Exam: alert and oriented to person, place, and time. Attention span and concentration intact, recent and remote memory intact, fund of knowledge intact.  Speech fluent and not dysarthric, language intact.  CN II-XII intact. Bulk and tone normal, muscle strength 5/5 throughout.  Sensation to light touch, temperature and vibration intact.  Deep tendon reflexes 2+ throughout, toes downgoing.  Finger to nose and heel to shin testing intact.  Gait normal, Romberg negative.  IMPRESSION: 1.  Embolic cerebellar stroke, bilateral.  At this point, working diagnosis is secondary to antiphospholipid antibody syndrome. 2.  Antiphospholipid antibody syndrome.  She has had hypercoagulable panel checked in  the past for her pregnancies and was never positive.  Therefore, it is suspected to be reactionary to herpes zoster. 3.  Post-herpetic neuralgia involving left upper extremity, most likely.  A structural etiology (disc bulge, spurring) is also possible.  She does have neck pain.  PLAN: 1.  Refer to PT for neck pain and we will see if it helps with the left arm paresthesias as well. 2.  Continue Coumadin.  She follows up with hematology.  If repeat testing for antiphospholipid antibody is negative, she may be taken off Coumadin.  If that is the case, then I would want to order a 30 day cardiac event monitor to rule out underlying paroxysmal atrial fibrillation, which would indicate remaining on anticoagulation. 3.  Continue pravastatin 80mg  daily for secondary stroke prevention. 4.  Follow up in 3 months.  25 minutes spent face to face with patient, over 50% spent discussing diagnosis and management.  Shon Millet, DO  CC: Shirlean Mylar, MD

## 2017-10-25 ENCOUNTER — Ambulatory Visit: Payer: BLUE CROSS/BLUE SHIELD | Admitting: Neurology

## 2017-11-05 ENCOUNTER — Telehealth: Payer: Self-pay | Admitting: Neurology

## 2017-11-05 ENCOUNTER — Other Ambulatory Visit: Payer: Self-pay | Admitting: Family Medicine

## 2017-11-05 DIAGNOSIS — I63441 Cerebral infarction due to embolism of right cerebellar artery: Secondary | ICD-10-CM

## 2017-11-05 DIAGNOSIS — H93239 Hyperacusis, unspecified ear: Secondary | ICD-10-CM

## 2017-11-05 NOTE — Telephone Encounter (Signed)
Pt's spouse left a VM message regarding pt and having some issues that he wanted to discuss with Dr Moises BloodJaffe's nurse

## 2017-11-05 NOTE — Telephone Encounter (Signed)
Called and spoke with Tammy SoursGreg. He said Pt awoke around 4am with a loud pop in her L. Ear. She went to PCP, INR 3.9, was advsd to see ENT for the noise she's now hearing. Not a ringing noise really. An MRI has been ordered for Thursday. They were advsd to contact neurology by the ENT.

## 2017-11-06 ENCOUNTER — Other Ambulatory Visit: Payer: Self-pay

## 2017-11-06 ENCOUNTER — Ambulatory Visit: Payer: BLUE CROSS/BLUE SHIELD | Attending: Neurology | Admitting: Physical Therapy

## 2017-11-06 ENCOUNTER — Encounter: Payer: Self-pay | Admitting: Physical Therapy

## 2017-11-06 DIAGNOSIS — M542 Cervicalgia: Secondary | ICD-10-CM | POA: Diagnosis present

## 2017-11-06 DIAGNOSIS — R252 Cramp and spasm: Secondary | ICD-10-CM | POA: Diagnosis present

## 2017-11-06 NOTE — Telephone Encounter (Signed)
Called and spoke witgh Manuela SchwartzGreg, advsd of MRA addition and that [facility] will contact them to schedule.

## 2017-11-06 NOTE — Telephone Encounter (Signed)
I would like to add an MRA of head as well.

## 2017-11-06 NOTE — Therapy (Signed)
[facility] [address] [place], [place], [address] Phone: [phone]   Fax:  [phone]  Physical Therapy Evaluation  Patient Details  Name: Martha Burke MRN: [id number] Date of Birth: November 08, 1960 Referring Provider: Everlena CooperJaffe   Encounter Date: 11/06/2017  PT End of Session - 11/06/17 0922    Visit Number  1    Date for PT Re-Evaluation  01/06/18    PT Start Time  0852    PT Stop Time  0940    PT Time Calculation (min)  48 min    Activity Tolerance  Patient tolerated treatment well    Behavior During Therapy  [place] [facility] for tasks assessed/performed       Past Medical History:  Diagnosis Date  . APS (antiphospholipid syndrome) (HCC) 04/09/2017  . Cerebrovascular accident (CVA) due to embolism of right cerebellar artery (HCC) 03/28/2017  . Chest pain of unknown etiology 11/25/2014  . Decreased hearing, left   . Dyspnea on exertion 11/25/2014  . Encounter for therapeutic drug monitoring 04/09/2017  . Impaired mobility and activities of daily living 04/02/2017  . Intermittent palpitations 11/25/2014  . Long term (current) use of anticoagulants 04/09/2017  . Migraine aura without headache   . Migraine headache   . Palpitations   . Palpitations   . Post herpetic neuralgia   . Vertigo 03/27/2017    Past Surgical History:  Procedure Laterality Date  . TONSILLECTOMY      There were no vitals filed for this visit.   Subjective Assessment - 11/06/17 0855    Subjective  Patient has a diagnosis on antiphosolipid antibody syndrome, she had a stroke in July.  Patient reports that over the time from the stroke she is unsure if she is compensating and started to develop neck pain and mm spasms in the neck and upper back.  Reports some HA symptoms at time.    Limitations  Reading;House hold activities    Patient Stated Goals  have less pain    Currently in Pain?  Yes    Pain Score  0-No pain    Pain Location  Neck    Pain Orientation   Left;Right    Pain Descriptors / Indicators  Aching    Pain Type  Acute pain    Pain Radiating Towards  has some left arm numbness     Pain Onset  More than a month ago    Pain Frequency  Intermittent    Aggravating Factors   stress, sleeping, head motions pain up to 6/10    Pain Relieving Factors  self massage seems to help, heat helps, Tylenol  pain can be 0/10    Effect of Pain on Daily Activities  just hurts         [facility] PT Assessment - 11/06/17 0001      Assessment   Medical Diagnosis  cervicalgia    Referring Provider  Everlena CooperJaffe    Onset Date/Surgical Date  03/06/17    Hand Dominance  Right      Precautions   Precautions  None      Balance Screen   Has the patient fallen in the past 6 months  No    Has the patient had a decrease in activity level because of a fear of falling?   No    Is the patient reluctant to leave their home because of a fear of falling?   No      Home Environment   Additional  Comments  does housework,       Prior Function   Level of Independence  Independent    Vocation  Unemployed    Leisure  goes to gym 3x/week, mostly cardio      Posture/Postural Control   Posture Comments  fwd head, rounded and elevated shoulders, appears to be in a gaurded posistion      ROM / Strength   AROM / PROM / Strength  AROM;Strength      AROM   Overall AROM Comments  cervical ROM decreased 50% for all motions with pain, worse pain was left sidebending, shoulder ROM was WFL's, she was tight with IR, struggled to reach the bra      Strength   Overall Strength Comments  shoulder strength was 4-/5 with a lot of upper trap compensation      Flexibility   Soft Tissue Assessment /Muscle Length  -- has some UE neural tension      Palpation   Palpation comment  she is very tight with spasms in the upper traps, the rhomboids and the cervical parapsinals             Objective measurements completed on examination: See above findings.      [facility] Adult PT  Treatment/Exercise - 11/06/17 0001      Modalities   Modalities  Moist Heat;Electrical Stimulation      Moist Heat Therapy   Number Minutes Moist Heat  15 Minutes    Moist Heat Location  Cervical      Electrical Stimulation   Electrical Stimulation Location  c/t area    Electrical Stimulation Action  IFC    Electrical Stimulation Parameters  supine    Electrical Stimulation Goals  Pain             PT Education - 11/06/17 0921    Education provided  Yes    Education Details  cervical and scapular retraction, shrugs and levator/upper trap stretches    Person(s) Educated  Patient    Methods  Explanation;Tactile cues;Demonstration;Handout    Comprehension  Verbalized understanding       PT Short Term Goals - 11/06/17 0925      PT SHORT TERM GOAL #1   Title  independent with initial HEP    Time  2    Period  Weeks    Status  New        PT Long Term Goals - 11/06/17 4098      PT LONG TERM GOAL #1   Title  understand proper posture and body mechanics    Time  8    Period  Weeks    Status  New      PT LONG TERM GOAL #2   Title  decrease pain 50%    Time  8    Period  Weeks    Status  New      PT LONG TERM GOAL #3   Title  increase cervical ROM 25%    Time  8    Period  Weeks    Status  New      PT LONG TERM GOAL #4   Title  reach bra with right hand without difficulty    Time  8    Period  Weeks    Status  New             Plan - 11/06/17 0923    Clinical Impression Statement  Patient had a stroke in July 2018, she  reports that over time she has developed neck pain, she feels from compensation and tension.  She has some decreased cervical ROM, she has significant tenderness and spasms in the upper traps and cervical area with knots palpable.  She has some positive neural tension signs in the upper extremities.    Clinical Presentation  Stable    Clinical Decision Making  Low    Rehab Potential  Good    PT Frequency  2x / week    PT Duration  8  weeks    PT Treatment/Interventions  ADLs/Self Care Home Management;Cryotherapy;Electrical Stimulation;Moist Heat;Traction;Neuromuscular re-education;Therapeutic exercise;Therapeutic activities;Functional mobility training;Patient/family education;Manual techniques;Taping;Dry needling    PT Next Visit Plan  start exercises for scapular stability watch for upper trap compensation    Consulted and Agree with Plan of Care  Patient       Patient will benefit from skilled therapeutic intervention in order to improve the following deficits and impairments:  Decreased range of motion, Increased fascial restricitons, Increased muscle spasms, Pain, Impaired flexibility, Improper body mechanics, Postural dysfunction, Decreased strength  Visit Diagnosis: Cervicalgia - Plan: PT plan of care cert/re-cert  Cramp and spasm - Plan: PT plan of care cert/re-cert     Problem List Patient Active Problem List   Diagnosis Date Noted  . APS (antiphospholipid syndrome) (HCC) 04/09/2017  . Encounter for therapeutic drug monitoring 04/09/2017  . Long term (current) use of anticoagulants 04/09/2017  . Impaired mobility and activities of daily living 04/02/2017  . Cerebrovascular accident (CVA) due to embolism of right cerebellar artery (HCC) 03/28/2017  . Vertigo 03/27/2017  . Intermittent palpitations 11/25/2014  . Dyspnea on exertion 11/25/2014  . Chest pain of unknown etiology 11/25/2014    Jearld Lesch., PT 11/06/2017, 9:29 AM  [facility] [place] [facility] [address] [place], [place], [address] Phone: [phone]   Fax:  [phone]  Name: Martha Burke MRN: [id number] Date of Birth: Feb 04, 1961

## 2017-11-07 ENCOUNTER — Telehealth: Payer: Self-pay | Admitting: Neurology

## 2017-11-07 NOTE — Telephone Encounter (Signed)
Patient husband states that patient is have MRa tomorrow and we need to get approval from the ins. Patient husband said that ins states we have not done a approval for the MRA please call when it is done so he knows

## 2017-11-07 NOTE — Telephone Encounter (Signed)
[facility]  Auth# [id number] valid from 02/20 until 03/21..  Called [facility], spoke with [place]Brianna. She will contact Pt, the MRA has not been scheduled. MRI is for tomorrow.

## 2017-11-07 NOTE — Telephone Encounter (Signed)
Called Tammy SoursGreg, advsd him I have contacted the PA department and there should not be a problem with getting the authorization prior to 1pm tomorrow

## 2017-11-08 ENCOUNTER — Ambulatory Visit
Admission: RE | Admit: 2017-11-08 | Discharge: 2017-11-08 | Disposition: A | Discharge status: Home / Self Care | Payer: BLUE CROSS/BLUE SHIELD | Source: Ambulatory Visit | Attending: Neurology | Admitting: Neurology

## 2017-11-08 ENCOUNTER — Telehealth: Payer: Self-pay

## 2017-11-08 ENCOUNTER — Ambulatory Visit
Admission: RE | Admit: 2017-11-08 | Discharge: 2017-11-08 | Disposition: A | Discharge status: Home / Self Care | Payer: BLUE CROSS/BLUE SHIELD | Source: Ambulatory Visit | Attending: Family Medicine | Admitting: Family Medicine

## 2017-11-08 DIAGNOSIS — H93239 Hyperacusis, unspecified ear: Secondary | ICD-10-CM

## 2017-11-08 DIAGNOSIS — I63441 Cerebral infarction due to embolism of right cerebellar artery: Secondary | ICD-10-CM

## 2017-11-08 MED ORDER — GADOBENATE DIMEGLUMINE 529 MG/ML IV SOLN
12.0000 mL | Freq: Once | INTRAVENOUS | Status: AC | PRN
  Administered 2017-11-08: 12 mL via INTRAVENOUS

## 2017-11-08 NOTE — Telephone Encounter (Signed)
-----   Message from Drema DallasAdam R Jaffe, DO sent at 11/08/2017  2:34 PM EST ----- MRI does not reveal anything concerning such as bleed, new stroke, tumor or aneurysm.

## 2017-11-08 NOTE — Telephone Encounter (Signed)
Called and spoke with Pt, advsd her of MRI/MRA results. Pt was relieved and thankful for the call.

## 2017-11-13 ENCOUNTER — Ambulatory Visit: Payer: BLUE CROSS/BLUE SHIELD | Admitting: Physical Therapy

## 2017-11-13 ENCOUNTER — Encounter: Payer: Self-pay | Admitting: Physical Therapy

## 2017-11-13 DIAGNOSIS — R252 Cramp and spasm: Secondary | ICD-10-CM

## 2017-11-13 DIAGNOSIS — M542 Cervicalgia: Secondary | ICD-10-CM | POA: Diagnosis not present

## 2017-11-13 NOTE — Therapy (Signed)
[place], [place], [address] Phone: [phone]   Fax:  [phone]  Physical Therapy Treatment  Patient Details  Name: Martha Burke MRN: [id number] Date of Birth: 09-Sep-1961 Referring Provider: Tomi Likens   Encounter Date: 11/13/2017  PT End of Session - 11/13/17 1729    Visit Number  2    Date for PT Re-Evaluation  01/06/18    PT Start Time  1400    PT Stop Time  1450    PT Time Calculation (min)  50 min    Activity Tolerance  Patient tolerated treatment well    Behavior During Therapy  [facility] for tasks assessed/performed       Past Medical History:  Diagnosis Date  . APS (antiphospholipid syndrome) ([place]) 04/09/2017  . Cerebrovascular accident (CVA) due to embolism of right cerebellar artery ([place]) 03/28/2017  . Chest pain of unknown etiology 11/25/2014  . Decreased hearing, left   . Dyspnea on exertion 11/25/2014  . Encounter for therapeutic drug monitoring 04/09/2017  . Impaired mobility and activities of daily living 04/02/2017  . Intermittent palpitations 11/25/2014  . Long term (current) use of anticoagulants 04/09/2017  . Migraine aura without headache   . Migraine headache   . Palpitations   . Palpitations   . Post herpetic neuralgia   . Vertigo 03/27/2017    Past Surgical History:  Procedure Laterality Date  . TONSILLECTOMY      There were no vitals filed for this visit.  Subjective Assessment - 11/13/17 1725    Subjective  Patient reports that she feels that the heat and estim helped.    Currently in Pain?  Yes    Pain Score  2     Pain Location  Neck    Aggravating Factors   just tense                      [facility] Adult PT Treatment/Exercise - 11/13/17 0001      Exercises   Exercises  Neck      Neck Exercises: Machines for Strengthening   UBE (Upper Arm Bike)  level 4 x 4 minutes    Cybex Row  20# 3x10 with verbal and tactile cues to decrease upper traps    Lat  Pull  20# 3x10      Neck Exercises: Standing   Neck Retraction  20 reps;3 secs    Other Standing Exercises  5# shrugs with upper trap and levator stretches      Moist Heat Therapy   Number Minutes Moist Heat  15 Minutes    Moist Heat Location  Cervical      Electrical Stimulation   Electrical Stimulation Location  c/t area    Electrical Stimulation Action  IFC    Electrical Stimulation Parameters  supine    Electrical Stimulation Goals  Pain      Manual Therapy   Manual Therapy  Soft tissue mobilization;Manual Traction    Soft tissue mobilization  gentle STM to the cervical area and the upper traps    Manual Traction  occipital release, gentle stretches and over pressures of the cervical ROM               PT Short Term Goals - 11/13/17 1731      PT SHORT TERM GOAL #1   Title  independent with initial HEP    Status  Partially Met  PT Long Term Goals - 11/06/17 0925      PT LONG TERM GOAL #1   Title  understand proper posture and body mechanics    Time  8    Period  Weeks    Status  New      PT LONG TERM GOAL #2   Title  decrease pain 50%    Time  8    Period  Weeks    Status  New      PT LONG TERM GOAL #3   Title  increase cervical ROM 25%    Time  8    Period  Weeks    Status  New      PT LONG TERM GOAL #4   Title  reach bra with right hand without difficulty    Time  8    Period  Weeks    Status  New            Plan - 11/13/17 1730    Clinical Impression Statement  Patient needs cues to decrease the shoulder elevation.  She is very tight in the SCM, the cervical paraspinals and the upper traps    PT Next Visit Plan  see how treatment went and add as tolerated    Consulted and Agree with Plan of Care  Patient       Patient will benefit from skilled therapeutic intervention in order to improve the following deficits and impairments:  Decreased range of motion, Increased fascial restricitons, Increased muscle spasms, Pain, Impaired  flexibility, Improper body mechanics, Postural dysfunction, Decreased strength  Visit Diagnosis: Cervicalgia  Cramp and spasm     Problem List Patient Active Problem List   Diagnosis Date Noted  . APS (antiphospholipid syndrome) ([place]) 04/09/2017  . Encounter for therapeutic drug monitoring 04/09/2017  . Long term (current) use of anticoagulants 04/09/2017  . Impaired mobility and activities of daily living 04/02/2017  . Cerebrovascular accident (CVA) due to embolism of right cerebellar artery ([place]) 03/28/2017  . Vertigo 03/27/2017  . Intermittent palpitations 11/25/2014  . Dyspnea on exertion 11/25/2014  . Chest pain of unknown etiology 11/25/2014    Sumner Boast., PT 11/13/2017, 5:32 PM  [place] [address] [place], [place], [address] Phone: [phone]   Fax:  [phone]  Name: Danashia Landers MRN: [id number] Date of Birth: 1961-06-25

## 2017-11-15 ENCOUNTER — Ambulatory Visit: Payer: BLUE CROSS/BLUE SHIELD | Admitting: Physical Therapy

## 2017-11-15 ENCOUNTER — Encounter: Payer: Self-pay | Admitting: Physical Therapy

## 2017-11-15 DIAGNOSIS — R252 Cramp and spasm: Secondary | ICD-10-CM

## 2017-11-15 DIAGNOSIS — M542 Cervicalgia: Secondary | ICD-10-CM

## 2017-11-15 NOTE — Therapy (Signed)
[facility] [place] [facility] [address] [place], [place], [address] Phone: [phone]   Fax:  [phone]  Physical Therapy Treatment  Patient Details  Name: Martha Burke MRN: [id number] Date of Birth: 1961/08/11 Referring Provider: Everlena Cooper   Encounter Date: 11/15/2017  PT End of Session - 11/15/17 1536    Visit Number  3    Date for PT Re-Evaluation  01/06/18    PT Start Time  1445    PT Stop Time  1550    PT Time Calculation (min)  65 min    Activity Tolerance  Patient tolerated treatment well    Behavior During Therapy  [facility] for tasks assessed/performed       Past Medical History:  Diagnosis Date  . APS (antiphospholipid syndrome) (HCC) 04/09/2017  . Cerebrovascular accident (CVA) due to embolism of right cerebellar artery (HCC) 03/28/2017  . Chest pain of unknown etiology 11/25/2014  . Decreased hearing, left   . Dyspnea on exertion 11/25/2014  . Encounter for therapeutic drug monitoring 04/09/2017  . Impaired mobility and activities of daily living 04/02/2017  . Intermittent palpitations 11/25/2014  . Long term (current) use of anticoagulants 04/09/2017  . Migraine aura without headache   . Migraine headache   . Palpitations   . Palpitations   . Post herpetic neuralgia   . Vertigo 03/27/2017    Past Surgical History:  Procedure Laterality Date  . TONSILLECTOMY      There were no vitals filed for this visit.  Subjective Assessment - 11/15/17 1453    Subjective  Patient reports that her upper traps were tired and that she was a little sore in teh occipital area.    Currently in Pain?  Yes    Pain Score  2     Pain Location  Neck    Pain Relieving Factors  he treatment helps                      [facility] Adult PT Treatment/Exercise - 11/15/17 0001      Neck Exercises: Machines for Strengthening   UBE (Upper Arm Bike)  constant work 25 watts 4 minutes    Nustep  Level 5 x 5 minutes    Cybex Row  20# 3x10  with verbal and tactile cues to decrease upper traps    Lat Pull  20# 3x10      Neck Exercises: Theraband   Shoulder Extension  20 reps;Limitations    Shoulder Extension Limitations  5 # on gym pulleys    Shoulder External Rotation  20 reps;Red      Neck Exercises: Standing   Other Standing Exercises  5# shrugs with upper trap and levator stretches      Neck Exercises: Seated   Other Seated Exercise  2# bent over row, reverse flies and extensions 2x10       Moist Heat Therapy   Number Minutes Moist Heat  15 Minutes    Moist Heat Location  Cervical      Electrical Stimulation   Electrical Stimulation Location  c/t area    Electrical Stimulation Action  IFC    Electrical Stimulation Parameters  supine    Electrical Stimulation Goals  Pain      Manual Therapy   Manual Therapy  Soft tissue mobilization;Manual Traction    Manual therapy comments  gentle passive depression of the shoulders to stretch the upper trap and levator  PT Short Term Goals - 11/15/17 1538      PT SHORT TERM GOAL #1   Title  independent with initial HEP    Status  Achieved        PT Long Term Goals - 11/06/17 0925      PT LONG TERM GOAL #1   Title  understand proper posture and body mechanics    Time  8    Period  Weeks    Status  New      PT LONG TERM GOAL #2   Title  decrease pain 50%    Time  8    Period  Weeks    Status  New      PT LONG TERM GOAL #3   Title  increase cervical ROM 25%    Time  8    Period  Weeks    Status  New      PT LONG TERM GOAL #4   Title  reach bra with right hand without difficulty    Time  8    Period  Weeks    Status  New            Plan - 11/15/17 1536    Clinical Impression Statement  Patient at times still needing cues to not elevate the shoulders.  She has a lot of knots in the upper traps.  She does not like the stretches because they "hurt".  During exercises needs cues for posture and form both verbal and tactile    PT  Next Visit Plan  continue to advance as tolerated, address spasms as needed    Consulted and Agree with Plan of Care  Patient       Patient will benefit from skilled therapeutic intervention in order to improve the following deficits and impairments:  Decreased range of motion, Increased fascial restricitons, Increased muscle spasms, Pain, Impaired flexibility, Improper body mechanics, Postural dysfunction, Decreased strength  Visit Diagnosis: Cervicalgia  Cramp and spasm     Problem List Patient Active Problem List   Diagnosis Date Noted  . APS (antiphospholipid syndrome) (HCC) 04/09/2017  . Encounter for therapeutic drug monitoring 04/09/2017  . Long term (current) use of anticoagulants 04/09/2017  . Impaired mobility and activities of daily living 04/02/2017  . Cerebrovascular accident (CVA) due to embolism of right cerebellar artery (HCC) 03/28/2017  . Vertigo 03/27/2017  . Intermittent palpitations 11/25/2014  . Dyspnea on exertion 11/25/2014  . Chest pain of unknown etiology 11/25/2014    Jearld LeschALBRIGHT,Saurav Crumble W., PT 11/15/2017, 3:38 PM  [facility] [address] [place], [place], [address] Phone: [phone]   Fax:  [phone]  Name: Martha Burke MRN: [id number] Date of Birth: 20-Nov-1960

## 2017-11-19 NOTE — Telephone Encounter (Signed)
7/27/18Records Rec[facility] at [facility] [place] for Appt. with Azalee CourseHao Meng, PA on 05/02/17 @ 1:30pm

## 2017-11-23 ENCOUNTER — Ambulatory Visit: Payer: BLUE CROSS/BLUE SHIELD | Attending: Neurology | Admitting: Physical Therapy

## 2017-11-23 ENCOUNTER — Encounter: Payer: Self-pay | Admitting: Physical Therapy

## 2017-11-23 DIAGNOSIS — R252 Cramp and spasm: Secondary | ICD-10-CM | POA: Insufficient documentation

## 2017-11-23 DIAGNOSIS — M542 Cervicalgia: Secondary | ICD-10-CM | POA: Diagnosis present

## 2017-11-23 NOTE — Therapy (Signed)
[facility] [address] [place], [place], [address] Phone: [phone]   Fax:  [phone]  Physical Therapy Treatment  Patient Details  Name: Martha MangoMarsha Burke MRN: [id number] Date of Birth: 04/25/1961 Referring Provider: Everlena CooperJaffe   Encounter Date: 11/23/2017  PT End of Session - 11/23/17 0900    Visit Number  4    Date for PT Re-Evaluation  01/06/18    PT Start Time  0800    PT Stop Time  0845    PT Time Calculation (min)  45 min    Activity Tolerance  Patient tolerated treatment well       Past Medical History:  Diagnosis Date   APS (antiphospholipid syndrome) (HCC) 04/09/2017   Cerebrovascular accident (CVA) due to embolism of right cerebellar artery (HCC) 03/28/2017   Chest pain of unknown etiology 11/25/2014   Decreased hearing, left    Dyspnea on exertion 11/25/2014   Encounter for therapeutic drug monitoring 04/09/2017   Impaired mobility and activities of daily living 04/02/2017   Intermittent palpitations 11/25/2014   Long term (current) use of anticoagulants 04/09/2017   Migraine aura without headache    Migraine headache    Palpitations    Palpitations    Post herpetic neuralgia    Vertigo 03/27/2017    Past Surgical History:  Procedure Laterality Date   TONSILLECTOMY      There were no vitals filed for this visit.  Subjective Assessment - 11/23/17 0854    Subjective  Cont to experience intermittent upper trap and C/S soreness/ tightness.  Typically will experience these symptoms when doing the dishes or preparing a meal, being bent over the counter                      [facility] Adult PT Treatment/Exercise - 11/23/17 0001      Neck Exercises: Machines for Strengthening   UBE (Upper Arm Bike)  constant work 25 watts 4 minutes    Nustep  Level 5 x 5 minutes    Cybex Row  20# 3x10 with verbal and tactile cues to decrease upper traps    Lat Pull  20# 3x10      Neck Exercises:  Theraband   Shoulder Extension  20 reps;Limitations    Shoulder Extension Limitations  15 # on gym pulleys    Shoulder External Rotation  20 reps;Green      Neck Exercises: Standing   Neck Retraction  20 reps;3 secs      Neck Exercises: Seated   Other Seated Exercise  -- T/S extension over towel roll with hands supporting C/S 10x5      Neck Exercises: Supine   Neck Retraction  10 reps;3 secs      Manual Therapy   Manual Therapy  Soft tissue mobilization;Myofascial release    Manual therapy comments  gentle passive depression of the shoulders to stretch the upper trap and levator    Soft tissue mobilization  gentle STM to the cervical area and the upper traps    Manual Traction  occipital release, gentle stretches and over pressures of the cervical ROM               PT Short Term Goals - 11/15/17 1538      PT SHORT TERM GOAL #1   Title  independent with initial HEP    Status  Achieved        PT Long Term Goals - 11/06/17 [id number]  PT LONG TERM GOAL #1   Title  understand proper posture and body mechanics    Time  8    Period  Weeks    Status  New      PT LONG TERM GOAL #2   Title  decrease pain 50%    Time  8    Period  Weeks    Status  New      PT LONG TERM GOAL #3   Title  increase cervical ROM 25%    Time  8    Period  Weeks    Status  New      PT LONG TERM GOAL #4   Title  reach bra with right hand without difficulty    Time  8    Period  Weeks    Status  New            Plan - 11/23/17 0900    Clinical Impression Statement  Cont to address postural awareness and correction.  Cont to progress periscapular strength.  Ongoing improvement in C/S and upper trap soft tissue mobility and flexibility.       Patient will benefit from skilled therapeutic intervention in order to improve the following deficits and impairments:     Visit Diagnosis: Cervicalgia  Cramp and spasm     Problem List Patient Active Problem List   Diagnosis Date  Noted   APS (antiphospholipid syndrome) (HCC) 04/09/2017   Encounter for therapeutic drug monitoring 04/09/2017   Long term (current) use of anticoagulants 04/09/2017   Impaired mobility and activities of daily living 04/02/2017   Cerebrovascular accident (CVA) due to embolism of right cerebellar artery (HCC) 03/28/2017   Vertigo 03/27/2017   Intermittent palpitations 11/25/2014   Dyspnea on exertion 11/25/2014   Chest pain of unknown etiology 11/25/2014    Tomie China, PTA 11/23/2017, 9:07 AM  [facility] [place] [address] [place], [place], [address] Phone: [phone]   Fax:  [phone]  Name: Martha Burke MRN: [id number] Date of Birth: 12/24/1960

## 2017-11-27 ENCOUNTER — Ambulatory Visit: Payer: BLUE CROSS/BLUE SHIELD | Admitting: Physical Therapy

## 2017-11-27 DIAGNOSIS — M542 Cervicalgia: Secondary | ICD-10-CM | POA: Diagnosis not present

## 2017-11-27 DIAGNOSIS — R252 Cramp and spasm: Secondary | ICD-10-CM

## 2017-11-27 NOTE — Therapy (Signed)
[facility] [address] [place], [place], [address] Phone: [phone]   Fax:  [phone]  Physical Therapy Treatment  Patient Details  Name: Martha Burke MRN: [id number] Date of Birth: [birth year] Referring Provider: Everlena Cooper   Encounter Date: 11/27/2017  PT End of Session - 11/27/17 1443    Visit Number  5    Date for PT Re-Evaluation  01/06/18    PT Start Time  1355    PT Stop Time  1500    PT Time Calculation (min)  65 min       Past Medical History:  Diagnosis Date  . APS (antiphospholipid syndrome) (HCC) 04/09/2017  . Cerebrovascular accident (CVA) due to embolism of right cerebellar artery (HCC) 03/28/2017  . Chest pain of unknown etiology 11/25/2014  . Decreased hearing, left   . Dyspnea on exertion 11/25/2014  . Encounter for therapeutic drug monitoring 04/09/2017  . Impaired mobility and activities of daily living 04/02/2017  . Intermittent palpitations 11/25/2014  . Long term (current) use of anticoagulants 04/09/2017  . Migraine aura without headache   . Migraine headache   . Palpitations   . Palpitations   . Post herpetic neuralgia   . Vertigo 03/27/2017    Past Surgical History:  Procedure Laterality Date  . TONSILLECTOMY      There were no vitals filed for this visit.  Subjective Assessment - 11/27/17 1359    Subjective  muscles weak and fatigued but working on better body mechanics    Currently in Pain?  No/denies                      [facility] Adult PT Treatment/Exercise - 11/27/17 0001      Neck Exercises: Machines for Strengthening   UBE (Upper Arm Bike)  constant work 25 watts 4 minutes    Nustep  L 5 6 min    Cybex Row  20# 3x10 with verbal and tactile cues to decrease upper traps    Lat Pull  20# 3x10      Neck Exercises: Theraband   Shoulder External Rotation  20 reps;Green    Other Theraband Exercises  3 pt stab 10 each      Neck Exercises: Standing   Neck Retraction   15 reps;3 secs head on wall    Other Standing Exercises  ball vs wall CC and CCW      Moist Heat Therapy   Number Minutes Moist Heat  15 Minutes    Moist Heat Location  Cervical      Electrical Stimulation   Electrical Stimulation Location  c/t area    Electrical Stimulation Action  IFC    Electrical Stimulation Parameters  supine    Electrical Stimulation Goals  Pain      Manual Therapy   Manual Therapy  Soft tissue mobilization;Myofascial release;Taping    Manual therapy comments  gentle passive depression of the shoulders to stretch the upper trap and levator    Soft tissue mobilization  gentle STM to the cervical area and the upper traps    Manual Traction  occipital release, gentle stretches and over pressures of the cervical ROM    Kinesiotex  Create Space traps               PT Short Term Goals - 11/15/17 1538      PT SHORT TERM GOAL #1   Title  independent with initial HEP    Status  Achieved        PT Long Term Goals - 11/06/17 0925      PT LONG TERM GOAL #1   Title  understand proper posture and body mechanics    Time  8    Period  Weeks    Status  New      PT LONG TERM GOAL #2   Title  decrease pain 50%    Time  8    Period  Weeks    Status  New      PT LONG TERM GOAL #3   Title  increase cervical ROM 25%    Time  8    Period  Weeks    Status  New      PT LONG TERM GOAL #4   Title  reach bra with right hand without difficulty    Time  8    Period  Weeks    Status  New            Plan - 11/27/17 1444    Clinical Impression Statement  fatigued with postural ex but no increas in pain. trigger pts BIL traps and cerv- trial of kinesiotape. good cerv ROM but c/o end range tightness    PT Treatment/Interventions  ADLs/Self Care Home Management;Cryotherapy;Electrical Stimulation;Moist Heat;Traction;Neuromuscular re-education;Therapeutic exercise;Therapeutic activities;Functional mobility training;Patient/family education;Manual  techniques;Taping;Dry needling    PT Next Visit Plan  assess kinesiotape and progress       Patient will benefit from skilled therapeutic intervention in order to improve the following deficits and impairments:  Decreased range of motion, Increased fascial restricitons, Increased muscle spasms, Pain, Impaired flexibility, Improper body mechanics, Postural dysfunction, Decreased strength  Visit Diagnosis: Cervicalgia  Cramp and spasm     Problem List Patient Active Problem List   Diagnosis Date Noted  . APS (antiphospholipid syndrome) (HCC) 04/09/2017  . Encounter for therapeutic drug monitoring 04/09/2017  . Long term (current) use of anticoagulants 04/09/2017  . Impaired mobility and activities of daily living 04/02/2017  . Cerebrovascular accident (CVA) due to embolism of right cerebellar artery (HCC) 03/28/2017  . Vertigo 03/27/2017  . Intermittent palpitations 11/25/2014  . Dyspnea on exertion 11/25/2014  . Chest pain of unknown etiology 11/25/2014    Goran Olden,ANGIE PTA 11/27/2017, 2:45 PM  [facility] [address] [place], [place], [address] Phone: [phone]   Fax:  [phone]  Name: Martha Burke MRN: [id number] Date of Birth: August 29, 1961

## 2017-11-29 ENCOUNTER — Ambulatory Visit: Payer: BLUE CROSS/BLUE SHIELD | Admitting: Physical Therapy

## 2017-11-29 ENCOUNTER — Encounter: Payer: Self-pay | Admitting: Physical Therapy

## 2017-11-29 DIAGNOSIS — M542 Cervicalgia: Secondary | ICD-10-CM | POA: Diagnosis not present

## 2017-11-29 DIAGNOSIS — R252 Cramp and spasm: Secondary | ICD-10-CM

## 2017-11-29 NOTE — Therapy (Signed)
[facility] [address] [place], [place], [address] Phone: [phone]   Fax:  [phone]  Physical Therapy Treatment  Patient Details  Name: Martha Burke MRN: [id number] Date of Birth: 1960-12-10 Referring Provider: Everlena CooperJaffe   Encounter Date: 11/29/2017  PT End of Session - 11/29/17 1515    Visit Number  6    Date for PT Re-Evaluation  01/06/18    PT Start Time  1430    PT Stop Time  1515    PT Time Calculation (min)  45 min    Activity Tolerance  Patient tolerated treatment well    Behavior During Therapy  [facility] for tasks assessed/performed       Past Medical History:  Diagnosis Date  . APS (antiphospholipid syndrome) (HCC) 04/09/2017  . Cerebrovascular accident (CVA) due to embolism of right cerebellar artery (HCC) 03/28/2017  . Chest pain of unknown etiology 11/25/2014  . Decreased hearing, left   . Dyspnea on exertion 11/25/2014  . Encounter for therapeutic drug monitoring 04/09/2017  . Impaired mobility and activities of daily living 04/02/2017  . Intermittent palpitations 11/25/2014  . Long term (current) use of anticoagulants 04/09/2017  . Migraine aura without headache   . Migraine headache   . Palpitations   . Palpitations   . Post herpetic neuralgia   . Vertigo 03/27/2017    Past Surgical History:  Procedure Laterality Date  . TONSILLECTOMY      There were no vitals filed for this visit.  Subjective Assessment - 11/29/17 1433    Subjective  Shoulder are a little tight, reports legs are tired from working out    Currently in Pain?  No/denies                      [facility] Adult [facility] Treatment/Exercise - 11/29/17 0001      Neck Exercises: Machines for Strengthening   UBE (Upper Arm Bike)  constant work 25 watts 4 minutes; L3 x2 min    Cybex Row  25# 2x10 with verbal and tactile cues to decrease upper traps      Neck Exercises: Theraband   Shoulder External Rotation  20 reps;Green    Other  Theraband Exercises  horiz abs green 2x10      Neck Exercises: Standing   Wall Push Ups  20 reps    Other Standing Exercises  ball vs wall CC and CCW      Manual Therapy   Manual Therapy  Soft tissue mobilization;Myofascial release    Manual therapy comments  gentle passive depression of the shoulders to stretch the upper trap and levator    Soft tissue mobilization  gentle STM to the cervical area and the upper traps    Manual Traction  occipital release, gentle stretches and over pressures of the cervical ROM    Kinesiotex  Create Space               PT Short Term Goals - 11/15/17 1538      PT SHORT TERM GOAL #1   Title  independent with initial HEP    Status  Achieved        PT Long Term Goals - 11/06/17 0925      PT LONG TERM GOAL #1   Title  understand proper posture and body mechanics    Time  8    Period  Weeks    Status  New      PT  LONG TERM GOAL #2   Title  decrease pain 50%    Time  8    Period  Weeks    Status  New      PT LONG TERM GOAL #3   Title  increase cervical ROM 25%    Time  8    Period  Weeks    Status  New      PT LONG TERM GOAL #4   Title  reach bra with right hand without difficulty    Time  8    Period  Weeks    Status  New            Plan - 11/29/17 1516    Clinical Impression Statement  Postural cues with seated rows. Pt reported no neck pain only shoulder tightness. multiple trigger points in upper traps. Continued kinesiotape   PT Frequency  2x / week    PT Duration  8 weeks    PT Treatment/Interventions  ADLs/Self Care Home Management;Cryotherapy;Electrical Stimulation;Moist Heat;Traction;Neuromuscular re-education;Therapeutic exercise;Therapeutic activities;Functional mobility training;Patient/family education;Manual techniques;Taping;Dry needling    PT Next Visit Plan  assess kinesiotape and progress       Patient will benefit from skilled therapeutic intervention in order to improve the following deficits and  impairments:  Decreased range of motion, Increased fascial restricitons, Increased muscle spasms, Pain, Impaired flexibility, Improper body mechanics, Postural dysfunction, Decreased strength  Visit Diagnosis: Cervicalgia  Cramp and spasm     Problem List Patient Active Problem List   Diagnosis Date Noted  . APS (antiphospholipid syndrome) (HCC) 04/09/2017  . Encounter for therapeutic drug monitoring 04/09/2017  . Long term (current) use of anticoagulants 04/09/2017  . Impaired mobility and activities of daily living 04/02/2017  . Cerebrovascular accident (CVA) due to embolism of right cerebellar artery (HCC) 03/28/2017  . Vertigo 03/27/2017  . Intermittent palpitations 11/25/2014  . Dyspnea on exertion 11/25/2014  . Chest pain of unknown etiology 11/25/2014    Grayce Sessions, PTA 11/29/2017, 3:18 PM  [facility] [place] [address] [place], [place], [address] Phone: [phone]   Fax:  [phone]  Name: Martha Burke MRN: [id number] Date of Birth: 1960/10/28

## 2017-12-03 ENCOUNTER — Ambulatory Visit: Payer: BLUE CROSS/BLUE SHIELD | Admitting: Physical Therapy

## 2017-12-03 ENCOUNTER — Encounter: Payer: Self-pay | Admitting: Physical Therapy

## 2017-12-03 DIAGNOSIS — M542 Cervicalgia: Secondary | ICD-10-CM

## 2017-12-03 DIAGNOSIS — R252 Cramp and spasm: Secondary | ICD-10-CM

## 2017-12-03 NOTE — Therapy (Signed)
[facility] [address] [place], [place], [address] Phone: [phone]   Fax:  [phone]  Physical Therapy Treatment  Patient Details  Name: Jamiah Homeyer MRN: [id number] Date of Birth: [birth year] Referring Provider: Everlena Cooper   Encounter Date: 12/03/2017  PT End of Session - 12/03/17 1651    Visit Number  7    Date for PT Re-Evaluation  01/06/18    PT Start Time  1600    PT Stop Time  1700    PT Time Calculation (min)  60 min    Activity Tolerance  Patient tolerated treatment well    Behavior During Therapy  [facility] for tasks assessed/performed       Past Medical History:  Diagnosis Date  . APS (antiphospholipid syndrome) (HCC) 04/09/2017  . Cerebrovascular accident (CVA) due to embolism of right cerebellar artery (HCC) 03/28/2017  . Chest pain of unknown etiology 11/25/2014  . Decreased hearing, left   . Dyspnea on exertion 11/25/2014  . Encounter for therapeutic drug monitoring 04/09/2017  . Impaired mobility and activities of daily living 04/02/2017  . Intermittent palpitations 11/25/2014  . Long term (current) use of anticoagulants 04/09/2017  . Migraine aura without headache   . Migraine headache   . Palpitations   . Palpitations   . Post herpetic neuralgia   . Vertigo 03/27/2017    Past Surgical History:  Procedure Laterality Date  . TONSILLECTOMY      There were no vitals filed for this visit.  Subjective Assessment - 12/03/17 1602    Subjective  "Pretty good, my shoulders I can feel it"    Currently in Pain?  Yes    Pain Score  4     Pain Location  -- neck and shoulder                      [facility] Adult PT Treatment/Exercise - 12/03/17 0001      Neck Exercises: Machines for Strengthening   UBE (Upper Arm Bike)  constant work 25 watts 4 minutes; L3 x2 min    Nustep  L 5 6 min    Cybex Row  25# 2x10 with verbal and tactile cues to decrease upper traps    Lat Pull  20# 3x10      Neck  Exercises: Theraband   Shoulder Extension  Limitations 2x8     Shoulder Extension Limitations  15 # on gym pulleys    Shoulder External Rotation  20 reps;Green    Other Theraband Exercises  3 way scap stabe x10    Other Theraband Exercises  horiz abs green 2x10      Neck Exercises: Standing   Wall Push Ups  20 reps      Moist Heat Therapy   Number Minutes Moist Heat  15 Minutes    Moist Heat Location  Cervical      Electrical Stimulation   Electrical Stimulation Location  c/t area    Electrical Stimulation Action  IFC    Electrical Stimulation Parameters  supine    Electrical Stimulation Goals  Pain      Manual Therapy   Manual Therapy  Soft tissue mobilization;Myofascial release    Manual therapy comments  gentle passive depression of the shoulders to stretch the upper trap and levator    Soft tissue mobilization  gentle STM to the cervical area and the upper traps    Manual Traction  occipital release, gentle stretches and over  pressures of the cervical ROM    Kinesiotex  Create Space               PT Short Term Goals - 11/15/17 1538      PT SHORT TERM GOAL #1   Title  independent with initial HEP    Status  Achieved        PT Long Term Goals - 11/06/17 0925      PT LONG TERM GOAL #1   Title  understand proper posture and body mechanics    Time  8    Period  Weeks    Status  New      PT LONG TERM GOAL #2   Title  decrease pain 50%    Time  8    Period  Weeks    Status  New      PT LONG TERM GOAL #3   Title  increase cervical ROM 25%    Time  8    Period  Weeks    Status  New      PT LONG TERM GOAL #4   Title  reach bra with right hand without difficulty    Time  8    Period  Weeks    Status  New            Plan - 12/03/17 1652    Clinical Impression Statement  Pt is progressing towards goals, reports running 2 miles before therapy, doing so she stated some tightness in her shoulders. Shoulder fatigue with 3 way scap stabilization.  Trigger points noted R>L    Rehab Potential  Good    PT Frequency  2x / week    PT Duration  8 weeks    PT Treatment/Interventions  ADLs/Self Care Home Management;Cryotherapy;Electrical Stimulation;Moist Heat;Traction;Neuromuscular re-education;Therapeutic exercise;Therapeutic activities;Functional mobility training;Patient/family education;Manual techniques;Taping;Dry needling    PT Next Visit Plan  assess kinesiotape and progress       Patient will benefit from skilled therapeutic intervention in order to improve the following deficits and impairments:  Decreased range of motion, Increased fascial restricitons, Increased muscle spasms, Pain, Impaired flexibility, Improper body mechanics, Postural dysfunction, Decreased strength  Visit Diagnosis: Cervicalgia  Cramp and spasm     Problem List Patient Active Problem List   Diagnosis Date Noted  . APS (antiphospholipid syndrome) (HCC) 04/09/2017  . Encounter for therapeutic drug monitoring 04/09/2017  . Long term (current) use of anticoagulants 04/09/2017  . Impaired mobility and activities of daily living 04/02/2017  . Cerebrovascular accident (CVA) due to embolism of right cerebellar artery (HCC) 03/28/2017  . Vertigo 03/27/2017  . Intermittent palpitations 11/25/2014  . Dyspnea on exertion 11/25/2014  . Chest pain of unknown etiology 11/25/2014    Grayce Sessionsonald G Tammara Massing, PTA 12/03/2017, 4:57 PM  [facility] [address] [place], [place], [address] Phone: [phone]   Fax:  [phone]  Name: Jolly MangoMarsha Kloth MRN: [id number] Date of Birth: 1961-07-26

## 2017-12-07 ENCOUNTER — Encounter: Payer: Self-pay | Admitting: Physical Therapy

## 2017-12-07 ENCOUNTER — Ambulatory Visit: Payer: BLUE CROSS/BLUE SHIELD | Admitting: Physical Therapy

## 2017-12-07 DIAGNOSIS — R252 Cramp and spasm: Secondary | ICD-10-CM

## 2017-12-07 DIAGNOSIS — M542 Cervicalgia: Secondary | ICD-10-CM | POA: Diagnosis not present

## 2017-12-07 NOTE — Therapy (Signed)
[facility] [address] [place], [place], [address] Phone: [phone]   Fax:  [phone]  Physical Therapy Treatment  Patient Details  Name: Martha MangoMarsha Skluzacek MRN: [id number] Date of Birth: 10/06/1960 Referring Provider: Everlena CooperJaffe   Encounter Date: 12/07/2017  PT End of Session - 12/07/17 0958    Visit Number  7    Date for PT Re-Evaluation  01/06/18    PT Start Time  0800    PT Stop Time  0845    PT Time Calculation (min)  45 min    Activity Tolerance  Patient tolerated treatment well    Behavior During Therapy  [facility] for tasks assessed/performed       Past Medical History:  Diagnosis Date   APS (antiphospholipid syndrome) (HCC) 04/09/2017   Cerebrovascular accident (CVA) due to embolism of right cerebellar artery (HCC) 03/28/2017   Chest pain of unknown etiology 11/25/2014   Decreased hearing, left    Dyspnea on exertion 11/25/2014   Encounter for therapeutic drug monitoring 04/09/2017   Impaired mobility and activities of daily living 04/02/2017   Intermittent palpitations 11/25/2014   Long term (current) use of anticoagulants 04/09/2017   Migraine aura without headache    Migraine headache    Palpitations    Palpitations    Post herpetic neuralgia    Vertigo 03/27/2017    Past Surgical History:  Procedure Laterality Date   TONSILLECTOMY      There were no vitals filed for this visit.  Subjective Assessment - 12/07/17 0827    Pain Score  3     Pain Location  Shoulder    Pain Orientation  Left;Right    Pain Descriptors / Indicators  Aching    Pain Frequency  Intermittent    Aggravating Factors   kitchen work    Pain Relieving Factors  adapting body mechanics                      [facility] Adult PT Treatment/Exercise - 12/07/17 0001      Neck Exercises: Machines for Strengthening   UBE (Upper Arm Bike)  constant work 25 watts 4 minutes; L3 x2 min    Nustep  L 5 6 min    Cybex Row   25# 2x10 with verbal and tactile cues to decrease upper traps    Lat Pull  20# 3x10      Neck Exercises: Theraband   Shoulder Extension Limitations  15 # on gym pulleys    Shoulder External Rotation  20 reps;Green      Neck Exercises: Standing   Wall Push Ups  20 reps      Manual Therapy   Manual Therapy  Soft tissue mobilization;Myofascial release    Manual therapy comments  gentle passive depression of the shoulders to stretch the upper trap and levator    Soft tissue mobilization  gentle STM to the cervical area and the upper traps    Manual Traction  occipital release, gentle stretches and over pressures of the cervical ROM    Kinesiotex  Create Space               PT Short Term Goals - 11/15/17 1538      PT SHORT TERM GOAL #1   Title  independent with initial HEP    Status  Achieved        PT Long Term Goals - 11/06/17 0925      PT LONG  TERM GOAL #1   Title  understand proper posture and body mechanics    Time  8    Period  Weeks    Status  New      PT LONG TERM GOAL #2   Title  decrease pain 50%    Time  8    Period  Weeks    Status  New      PT LONG TERM GOAL #3   Title  increase cervical ROM 25%    Time  8    Period  Weeks    Status  New      PT LONG TERM GOAL #4   Title  reach bra with right hand without difficulty    Time  8    Period  Weeks    Status  New            Plan - 12/07/17 1610    Clinical Impression Statement  Improving postural and scapular position awareness. does have some ongoing increased soft tissue tension throughout Right> Left upper trap, levator scap, and C/S paraspinals.         Patient will benefit from skilled therapeutic intervention in order to improve the following deficits and impairments:     Visit Diagnosis: Cervicalgia  Cramp and spasm     Problem List Patient Active Problem List   Diagnosis Date Noted   APS (antiphospholipid syndrome) (HCC) 04/09/2017   Encounter for therapeutic drug  monitoring 04/09/2017   Long term (current) use of anticoagulants 04/09/2017   Impaired mobility and activities of daily living 04/02/2017   Cerebrovascular accident (CVA) due to embolism of right cerebellar artery (HCC) 03/28/2017   Vertigo 03/27/2017   Intermittent palpitations 11/25/2014   Dyspnea on exertion 11/25/2014   Chest pain of unknown etiology 11/25/2014    Martha Burke, PTA 12/07/2017, 10:00 AM  [facility] [place] [facility] [address] [place], [place], [address] Phone: [phone]   Fax:  [phone]  Name: Martha Burke MRN: [id number] Date of Birth: 06-Nov-1960

## 2017-12-11 ENCOUNTER — Encounter: Payer: Self-pay | Admitting: Physical Therapy

## 2017-12-11 ENCOUNTER — Ambulatory Visit: Payer: BLUE CROSS/BLUE SHIELD | Admitting: Physical Therapy

## 2017-12-11 DIAGNOSIS — R252 Cramp and spasm: Secondary | ICD-10-CM

## 2017-12-11 DIAGNOSIS — M542 Cervicalgia: Secondary | ICD-10-CM | POA: Diagnosis not present

## 2017-12-11 NOTE — Therapy (Signed)
[facility] [address] [place], [place], [address] Phone: [phone]   Fax:  [phone]  Physical Therapy Treatment  Patient Details  Name: Martha MangoMarsha Burke MRN: [id number] Date of Birth: [birth year] Referring Provider: Everlena CooperJaffe   Encounter Date: 12/11/2017  PT End of Session - 12/11/17 1221    Visit Number  8    Date for PT Re-Evaluation  01/06/18    PT Start Time  1145    PT Stop Time  1239    PT Time Calculation (min)  54 min    Activity Tolerance  Patient tolerated treatment well    Behavior During Therapy  [facility] for tasks assessed/performed       Past Medical History:  Diagnosis Date  . APS (antiphospholipid syndrome) (HCC) 04/09/2017  . Cerebrovascular accident (CVA) due to embolism of right cerebellar artery (HCC) 03/28/2017  . Chest pain of unknown etiology 11/25/2014  . Decreased hearing, left   . Dyspnea on exertion 11/25/2014  . Encounter for therapeutic drug monitoring 04/09/2017  . Impaired mobility and activities of daily living 04/02/2017  . Intermittent palpitations 11/25/2014  . Long term (current) use of anticoagulants 04/09/2017  . Migraine aura without headache   . Migraine headache   . Palpitations   . Palpitations   . Post herpetic neuralgia   . Vertigo 03/27/2017    Past Surgical History:  Procedure Laterality Date  . TONSILLECTOMY      There were no vitals filed for this visit.  Subjective Assessment - 12/11/17 1146    Subjective  "Feeling good"    Currently in Pain?  No/denies Just that stiff stuff but that is getting better    Pain Score  0-No pain                No data recorded       [facility] Adult PT Treatment/Exercise - 12/11/17 0001      Neck Exercises: Machines for Strengthening   UBE (Upper Arm Bike)  constant work 25 watts 4 minutes;    Nustep  L 5 6 min    Cybex Row  25# 2x10 with verbal and tactile cues to decrease upper traps    Lat Pull  25# 2x10      Neck  Exercises: Theraband   Shoulder External Rotation  20 reps;Green    Other Theraband Exercises  horiz abd green 2x10      Neck Exercises: Standing   Wall Push Ups  20 reps      Moist Heat Therapy   Number Minutes Moist Heat  15 Minutes    Moist Heat Location  Cervical      Electrical Stimulation   Electrical Stimulation Location  c/t area    Electrical Stimulation Action  IFC    Electrical Stimulation Parameters  supine    Electrical Stimulation Goals  Pain      Manual Therapy   Manual Therapy  Soft tissue mobilization;Myofascial release    Manual therapy comments  gentle passive depression of the shoulders to stretch the upper trap and levator    Soft tissue mobilization  gentle STM to the cervical area and the upper traps    Manual Traction  occipital release, gentle stretches and over pressures of the cervical ROM    Kinesiotex  Create Space               PT Short Term Goals - 11/15/17 1538      PT SHORT  TERM GOAL #1   Title  independent with initial HEP    Status  Achieved        PT Long Term Goals - 11/06/17 0925      PT LONG TERM GOAL #1   Title  understand proper posture and body mechanics    Time  8    Period  Weeks    Status  New      PT LONG TERM GOAL #2   Title  decrease pain 50%    Time  8    Period  Weeks    Status  New      PT LONG TERM GOAL #3   Title  increase cervical ROM 25%    Time  8    Period  Weeks    Status  New      PT LONG TERM GOAL #4   Title  reach bra with right hand without difficulty    Time  8    Period  Weeks    Status  New            Plan - 12/11/17 1222    Clinical Impression Statement  Pt reports improvement overall, stated that's he has been running with his daughter. Postural awareness with wall pushups and seated rows. Reports awareness at home as well when cooking. Less tightness noted in R trap.     Rehab Potential  Good    PT Frequency  2x / week    PT Duration  8 weeks    PT  Treatment/Interventions  ADLs/Self Care Home Management;Cryotherapy;Electrical Stimulation;Moist Heat;Traction;Neuromuscular re-education;Therapeutic exercise;Therapeutic activities;Functional mobility training;Patient/family education;Manual techniques;Taping;Dry needling    PT Next Visit Plan  assess kinesiotape and progress       Patient will benefit from skilled therapeutic intervention in order to improve the following deficits and impairments:  Decreased range of motion, Increased fascial restricitons, Increased muscle spasms, Pain, Impaired flexibility, Improper body mechanics, Postural dysfunction, Decreased strength  Visit Diagnosis: Cramp and spasm  Cervicalgia     Problem List Patient Active Problem List   Diagnosis Date Noted  . APS (antiphospholipid syndrome) (HCC) 04/09/2017  . Encounter for therapeutic drug monitoring 04/09/2017  . Long term (current) use of anticoagulants 04/09/2017  . Impaired mobility and activities of daily living 04/02/2017  . Cerebrovascular accident (CVA) due to embolism of right cerebellar artery (HCC) 03/28/2017  . Vertigo 03/27/2017  . Intermittent palpitations 11/25/2014  . Dyspnea on exertion 11/25/2014  . Chest pain of unknown etiology 11/25/2014    Grayce Sessions 12/11/2017, 12:24 PM  [facility] [address] [place], [place], [address] Phone: [phone]   Fax:  [phone]  Name: Martha Burke MRN: [id number] Date of Birth: [birth year]

## 2017-12-13 ENCOUNTER — Ambulatory Visit: Payer: BLUE CROSS/BLUE SHIELD | Admitting: Physical Therapy

## 2017-12-13 DIAGNOSIS — R252 Cramp and spasm: Secondary | ICD-10-CM

## 2017-12-13 DIAGNOSIS — M542 Cervicalgia: Secondary | ICD-10-CM

## 2017-12-13 NOTE — Therapy (Signed)
[place], [place], [address] Phone: [phone]   Fax:  [phone]  Physical Therapy Treatment  Patient Details  Name: Martha Burke MRN: [id number] Date of Birth: 07-02-1961 Referring Provider: Tomi Likens   Encounter Date: 12/13/2017  PT End of Session - 12/13/17 1226    Visit Number  9    Date for PT Re-Evaluation  01/06/18    PT Start Time  6962    PT Stop Time  1240    PT Time Calculation (min)  55 min       Past Medical History:  Diagnosis Date  . APS (antiphospholipid syndrome) ([place]) 04/09/2017  . Cerebrovascular accident (CVA) due to embolism of right cerebellar artery ([place]) 03/28/2017  . Chest pain of unknown etiology 11/25/2014  . Decreased hearing, left   . Dyspnea on exertion 11/25/2014  . Encounter for therapeutic drug monitoring 04/09/2017  . Impaired mobility and activities of daily living 04/02/2017  . Intermittent palpitations 11/25/2014  . Long term (current) use of anticoagulants 04/09/2017  . Migraine aura without headache   . Migraine headache   . Palpitations   . Palpitations   . Post herpetic neuralgia   . Vertigo 03/27/2017    Past Surgical History:  Procedure Laterality Date  . TONSILLECTOMY      There were no vitals filed for this visit.  Subjective Assessment - 12/13/17 1149    Subjective  RT trap increased pain after cooking last night    Currently in Pain?  Yes    Pain Score  4     Pain Location  Neck    Pain Orientation  Right                No data recorded       [facility] Adult PT Treatment/Exercise - 12/13/17 0001      Neck Exercises: Machines for Strengthening   UBE (Upper Arm Bike)  3 fwd/3 back      Neck Exercises: Standing   Other Standing Exercises  3# 4 pt rhy stab 15 each      Neck Exercises: Supine   Neck Retraction  15 reps;3 secs on ball plus added UE ex      Moist Heat Therapy   Number Minutes Moist Heat  15 Minutes    Moist  Heat Location  Cervical      Electrical Stimulation   Electrical Stimulation Location  c/t area    Electrical Stimulation Action  IFC    Electrical Stimulation Parameters  supine    Electrical Stimulation Goals  Pain      Manual Therapy   Manual Therapy  Soft tissue mobilization;Myofascial release    Manual therapy comments  gentle passive depression of the shoulders to stretch the upper trap and levator    Soft tissue mobilization  STM to the cervical area and TPR on RT UT    Manual Traction  occipital release, gentle stretches and over pressures of the cervical ROM             PT Education - 12/13/17 1150    Education provided  Yes    Education Details  BM and postural ed with cooking and dishes    Person(s) Educated  Patient    Methods  Explanation;Demonstration    Comprehension  Verbalized understanding;Returned demonstration       PT Short Term Goals - 11/15/17 1538      PT SHORT TERM GOAL #1  Title  independent with initial HEP    Status  Achieved        PT Long Term Goals - 12/13/17 1225      PT LONG TERM GOAL #1   Title  understand proper posture and body mechanics    Status  Partially Met      PT LONG TERM GOAL #2   Title  decrease pain 50%    Status  Achieved      PT LONG TERM GOAL #3   Title  increase cervical ROM 25%    Baseline  full cerv ROM, some end range tightness felt    Status  Achieved      PT LONG TERM GOAL #4   Title  reach bra with right hand without difficulty    Status  Partially Met            Plan - 12/13/17 1226    Clinical Impression Statement  progressing with LTGS. full CERV ROM but verb fels some end range tightness. large trigger point in RT UE possibly from cooking-discussed better BM    PT Treatment/Interventions  ADLs/Self Care Home Management;Cryotherapy;Electrical Stimulation;Moist Heat;Traction;Neuromuscular re-education;Therapeutic exercise;Therapeutic activities;Functional mobility training;Patient/family  education;Manual techniques;Taping;Dry needling    PT Next Visit Plan  possibly DN       Patient will benefit from skilled therapeutic intervention in order to improve the following deficits and impairments:  Decreased range of motion, Increased fascial restricitons, Increased muscle spasms, Pain, Impaired flexibility, Improper body mechanics, Postural dysfunction, Decreased strength  Visit Diagnosis: Cramp and spasm  Cervicalgia     Problem List Patient Active Problem List   Diagnosis Date Noted  . APS (antiphospholipid syndrome) ([place]) 04/09/2017  . Encounter for therapeutic drug monitoring 04/09/2017  . Long term (current) use of anticoagulants 04/09/2017  . Impaired mobility and activities of daily living 04/02/2017  . Cerebrovascular accident (CVA) due to embolism of right cerebellar artery ([place]) 03/28/2017  . Vertigo 03/27/2017  . Intermittent palpitations 11/25/2014  . Dyspnea on exertion 11/25/2014  . Chest pain of unknown etiology 11/25/2014    Ulus Hazen,ANGIE PTA 12/13/2017, 12:30 PM  [place] [address] [place], [place], [address] Phone: [phone]   Fax:  [phone]  Name: Martha Burke MRN: [id number] Date of Birth: [birth year]

## 2017-12-17 ENCOUNTER — Encounter: Payer: Self-pay | Admitting: Physical Therapy

## 2017-12-17 ENCOUNTER — Ambulatory Visit: Payer: BLUE CROSS/BLUE SHIELD | Attending: Neurology | Admitting: Physical Therapy

## 2017-12-17 DIAGNOSIS — M542 Cervicalgia: Secondary | ICD-10-CM

## 2017-12-17 DIAGNOSIS — R252 Cramp and spasm: Secondary | ICD-10-CM | POA: Insufficient documentation

## 2017-12-17 NOTE — Therapy (Signed)
[place], [place], [address] Phone: [phone]   Fax:  [phone]  Physical Therapy Treatment  Patient Details  Name: Martha Burke MRN: [id number] Date of Birth: [birth year] Referring Provider: Tomi Likens   Encounter Date: 12/17/2017  PT End of Session - 12/17/17 1500    Visit Number  10    Date for PT Re-Evaluation  01/06/18    PT Start Time  1430    PT Stop Time  1530    PT Time Calculation (min)  60 min    Activity Tolerance  Patient tolerated treatment well    Behavior During Therapy  [facility] for tasks assessed/performed       Past Medical History:  Diagnosis Date  . APS (antiphospholipid syndrome) ([place]) 04/09/2017  . Cerebrovascular accident (CVA) due to embolism of right cerebellar artery ([place]) 03/28/2017  . Chest pain of unknown etiology 11/25/2014  . Decreased hearing, left   . Dyspnea on exertion 11/25/2014  . Encounter for therapeutic drug monitoring 04/09/2017  . Impaired mobility and activities of daily living 04/02/2017  . Intermittent palpitations 11/25/2014  . Long term (current) use of anticoagulants 04/09/2017  . Migraine aura without headache   . Migraine headache   . Palpitations   . Palpitations   . Post herpetic neuralgia   . Vertigo 03/27/2017    Past Surgical History:  Procedure Laterality Date  . TONSILLECTOMY      There were no vitals filed for this visit.  Subjective Assessment - 12/17/17 1437    Subjective  "ok, a little stressed"    Currently in Pain?  No/denies    Pain Score  0-No pain    Pain Location  -- R shoulder tighness                       [facility] Adult PT Treatment/Exercise - 12/17/17 0001      Neck Exercises: Machines for Strengthening   UBE (Upper Arm Bike)  constant work 20 watts 3 fwd/3 back      Moist Heat Therapy   Number Minutes Moist Heat  15 Minutes    Moist Heat Location  Cervical      Electrical Stimulation   Electrical  Stimulation Location  c/t area    Electrical Stimulation Action  IFC    Electrical Stimulation Parameters  supine    Electrical Stimulation Goals  Pain      Manual Therapy   Manual Therapy  Soft tissue mobilization;Myofascial release    Manual therapy comments  gentle passive depression of the shoulders to stretch the upper trap and levator    Soft tissue mobilization  STM to the cervical area and TPR on RT UT    Manual Traction  occipital release, gentle stretches and over pressures of the cervical ROM       Trigger Point Dry Needling - 12/17/17 1459    Consent Given?  Yes    Education Handout Provided  Yes    Muscles Treated Upper Body  Upper trapezius;Levator scapulae    Upper Trapezius Response  Twitch reponse elicited;Palpable increased muscle length             PT Short Term Goals - 11/15/17 1538      PT SHORT TERM GOAL #1   Title  independent with initial HEP    Status  Achieved        PT Long Term Goals - 12/13/17 1225  PT LONG TERM GOAL #1   Title  understand proper posture and body mechanics    Status  Partially Met      PT LONG TERM GOAL #2   Title  decrease pain 50%    Status  Achieved      PT LONG TERM GOAL #3   Title  increase cervical ROM 25%    Baseline  full cerv ROM, some end range tightness felt    Status  Achieved      PT LONG TERM GOAL #4   Title  reach bra with right hand without difficulty    Status  Partially Met            Plan - 12/17/17 1501    Clinical Impression Statement  Had good twitch response with upper trap and levator dry needling, reported feeling tired after the treatment,     PT Next Visit Plan  see if DN helped    Consulted and Agree with Plan of Care  Patient       Patient will benefit from skilled therapeutic intervention in order to improve the following deficits and impairments:  Decreased range of motion, Increased fascial restricitons, Increased muscle spasms, Pain, Impaired flexibility, Improper  body mechanics, Postural dysfunction, Decreased strength  Visit Diagnosis: Cramp and spasm  Cervicalgia     Problem List Patient Active Problem List   Diagnosis Date Noted  . APS (antiphospholipid syndrome) ([place]) 04/09/2017  . Encounter for therapeutic drug monitoring 04/09/2017  . Long term (current) use of anticoagulants 04/09/2017  . Impaired mobility and activities of daily living 04/02/2017  . Cerebrovascular accident (CVA) due to embolism of right cerebellar artery ([place]) 03/28/2017  . Vertigo 03/27/2017  . Intermittent palpitations 11/25/2014  . Dyspnea on exertion 11/25/2014  . Chest pain of unknown etiology 11/25/2014    Sumner Boast., PT 12/17/2017, 3:03 PM  [place] [address] [place], [place], [address] Phone: [phone]   Fax:  [phone]  Name: Martha Burke MRN: [id number] Date of Birth: [birth year]

## 2017-12-20 ENCOUNTER — Ambulatory Visit: Payer: BLUE CROSS/BLUE SHIELD | Admitting: Physical Therapy

## 2018-01-01 ENCOUNTER — Ambulatory Visit: Payer: BLUE CROSS/BLUE SHIELD | Admitting: Physical Therapy

## 2018-01-01 ENCOUNTER — Encounter: Payer: Self-pay | Admitting: Physical Therapy

## 2018-01-01 DIAGNOSIS — R252 Cramp and spasm: Secondary | ICD-10-CM

## 2018-01-01 DIAGNOSIS — M542 Cervicalgia: Secondary | ICD-10-CM

## 2018-01-01 NOTE — Therapy (Signed)
[place] [facility] [place] [address] [place], [place], [address] Phone: [phone]   Fax:  [phone]  Physical Therapy Treatment  Patient Details  Name: Martha Burke MRN: [id number] Date of Birth: May 23, 1961 Referring Provider: Everlena Cooper   Encounter Date: 01/01/2018  PT End of Session - 01/01/18 1117    Visit Number  11    Date for PT Re-Evaluation  02/05/18    PT Start Time  1011    PT Stop Time  1115    PT Time Calculation (min)  64 min    Activity Tolerance  Patient tolerated treatment well    Behavior During Therapy  [facility] for tasks assessed/performed       Past Medical History:  Diagnosis Date  . APS (antiphospholipid syndrome) (HCC) 04/09/2017  . Cerebrovascular accident (CVA) due to embolism of right cerebellar artery (HCC) 03/28/2017  . Chest pain of unknown etiology 11/25/2014  . Decreased hearing, left   . Dyspnea on exertion 11/25/2014  . Encounter for therapeutic drug monitoring 04/09/2017  . Impaired mobility and activities of daily living 04/02/2017  . Intermittent palpitations 11/25/2014  . Long term (current) use of anticoagulants 04/09/2017  . Migraine aura without headache   . Migraine headache   . Palpitations   . Palpitations   . Post herpetic neuralgia   . Vertigo 03/27/2017    Past Surgical History:  Procedure Laterality Date  . TONSILLECTOMY      There were no vitals filed for this visit.  Subjective Assessment - 01/01/18 1103    Subjective  Patient reports that she has been overall doing better, she reports that stress seems to really increase the pain in the right upper trap.  She also reports some balance issues    Currently in Pain?  Yes    Pain Score  3     Pain Location  Neck    Pain Orientation  Right    Aggravating Factors   kitchen work does cause the right upper trap pain                       [facility] Adult PT Treatment/Exercise - 01/01/18 0001      High Level Balance   High Level Balance Comments  worked on high level balance activities on foam pad and on bosu with head turns eyes closed, ball toss, UE activities and changing the BOS      Moist Heat Therapy   Number Minutes Moist Heat  15 Minutes    Moist Heat Location  Cervical      Electrical Stimulation   Electrical Stimulation Location  right upper    Electrical Stimulation Action  IFC     Electrical Stimulation Parameters  supine    Electrical Stimulation Goals  Pain      Manual Therapy   Manual Therapy  Soft tissue mobilization;Myofascial release    Soft tissue mobilization  STM to the cervical area and TPR on RT UT    Manual Traction  occipital release, gentle stretches and over pressures of the cervical ROM               PT Short Term Goals - 11/15/17 1538      PT SHORT TERM GOAL #1   Title  independent with initial HEP    Status  Achieved        PT Long Term Goals - 01/01/18 1122      PT LONG TERM  GOAL #1   Title  understand proper posture and body mechanics    Status  Achieved      PT LONG TERM GOAL #4   Title  reach bra with right hand without difficulty    Status  Achieved            Plan - 01/01/18 1117    Clinical Impression Statement  Patient overall reports she is much stronger and able to do more since starting therapy, she is back doing some gym exercises.  She reports some difficulty with balance at times, she also reports continued right upper trap and neck issues mostly with doing dishes, cooking etc.., I talked with her today about behavior modification of doing gentle exercises every 5-10 minutes while doing those activities.  She does have right shoulder elevation when I really challenged her balance, she did have balance difficulty when on airex and Bosus, with eyes closed and with head turns.  I feel if we continue we could help her address these deficits    PT Next Visit Plan  Write renewal, work on balance and the significant trigger points in the  upper traps, these do refer pain to neck, head and upper back    Consulted and Agree with Plan of Care  Patient       Patient will benefit from skilled therapeutic intervention in order to improve the following deficits and impairments:  Decreased range of motion, Increased fascial restricitons, Increased muscle spasms, Pain, Impaired flexibility, Improper body mechanics, Postural dysfunction, Decreased strength  Visit Diagnosis: Cramp and spasm - Plan: PT plan of care cert/re-cert  Cervicalgia - Plan: PT plan of care cert/re-cert     Problem List Patient Active Problem List   Diagnosis Date Noted  . APS (antiphospholipid syndrome) (HCC) 04/09/2017  . Encounter for therapeutic drug monitoring 04/09/2017  . Long term (current) use of anticoagulants 04/09/2017  . Impaired mobility and activities of daily living 04/02/2017  . Cerebrovascular accident (CVA) due to embolism of right cerebellar artery (HCC) 03/28/2017  . Vertigo 03/27/2017  . Intermittent palpitations 11/25/2014  . Dyspnea on exertion 11/25/2014  . Chest pain of unknown etiology 11/25/2014    Jearld LeschALBRIGHT,Manly Nestle W., PT 01/01/2018, 11:26 AM  [facility] [address] [place], [place], [address] Phone: [phone]   Fax:  [phone]  Name: Martha Burke MRN: [id number] Date of Birth: 01/19/1961

## 2018-01-08 ENCOUNTER — Ambulatory Visit: Payer: BLUE CROSS/BLUE SHIELD | Admitting: Physical Therapy

## 2018-01-08 DIAGNOSIS — R252 Cramp and spasm: Secondary | ICD-10-CM | POA: Diagnosis not present

## 2018-01-08 DIAGNOSIS — M542 Cervicalgia: Secondary | ICD-10-CM

## 2018-01-08 NOTE — Therapy (Signed)
[facility] [place] [address] [place], [place], [address] Phone: [phone]   Fax:  [phone]  Physical Therapy Treatment  Patient Details  Name: Martha Burke MRN: [id number] Date of Birth: 1960-11-18 Referring Provider: Everlena CooperJaffe   Encounter Date: 01/08/2018  PT End of Session - 01/08/18 1317    Visit Number  12    Date for PT Re-Evaluation  02/05/18    PT Start Time  1230    PT Stop Time  1330    PT Time Calculation (min)  60 min       Past Medical History:  Diagnosis Date  . APS (antiphospholipid syndrome) (HCC) 04/09/2017  . Cerebrovascular accident (CVA) due to embolism of right cerebellar artery (HCC) 03/28/2017  . Chest pain of unknown etiology 11/25/2014  . Decreased hearing, left   . Dyspnea on exertion 11/25/2014  . Encounter for therapeutic drug monitoring 04/09/2017  . Impaired mobility and activities of daily living 04/02/2017  . Intermittent palpitations 11/25/2014  . Long term (current) use of anticoagulants 04/09/2017  . Migraine aura without headache   . Migraine headache   . Palpitations   . Palpitations   . Post herpetic neuralgia   . Vertigo 03/27/2017    Past Surgical History:  Procedure Laterality Date  . TONSILLECTOMY      There were no vitals filed for this visit.  Subjective Assessment - 01/08/18 1313    Subjective  find myself holding my husband hand for stability with walking0 hard to look around and walk without LOB. Tightness in RT UT and compensation    Currently in Pain?  No/denies                       [facility] Adult PT Treatment/Exercise - 01/08/18 0001      High Level Balance   High Level Balance Comments  airex and BOSU balance activities- decreased reaction      Modalities   Modalities  Ultrasound      Moist Heat Therapy   Number Minutes Moist Heat  15 Minutes    Moist Heat Location  Cervical      Electrical Stimulation   Electrical Stimulation Location   right upper    Electrical Stimulation Action  IFC    Electrical Stimulation Parameters  supine    Electrical Stimulation Goals  Pain      Ultrasound   Ultrasound Location  RT UT/cerv/rhom    Ultrasound Parameters  COMBO     Ultrasound Goals  -- decrease muscle tightness      Manual Therapy   Manual Therapy  Soft tissue mobilization    Soft tissue mobilization  DSTW to RT UT/cerv and rhom             PT Education - 01/08/18 1315    Education provided  Yes    Education Details  airex and BOSU at gym for balance    Person(s) Educated  Patient    Methods  Explanation    Comprehension  Verbalized understanding;Returned demonstration       PT Short Term Goals - 11/15/17 1538      PT SHORT TERM GOAL #1   Title  independent with initial HEP    Status  Achieved        PT Long Term Goals - 01/01/18 1122      PT LONG TERM GOAL #1   Title  understand proper posture and body mechanics  Status  Achieved      PT LONG TERM GOAL #4   Title  reach bra with right hand without difficulty    Status  Achieved            Plan - 01/08/18 1317    Clinical Impression Statement  large trigger pt in RT UT and rhom- responded well and tolerated modalites and STW well. decreased reaction time on non compliant surfaces    PT Treatment/Interventions  ADLs/Self Care Home Management;Cryotherapy;Electrical Stimulation;Moist Heat;Traction;Neuromuscular re-education;Therapeutic exercise;Therapeutic activities;Functional mobility training;Patient/family education;Manual techniques;Taping;Dry needling    PT Next Visit Plan  High level balance work and address trigger points in the upper traps, these do refer pain to neck, head and upper back       Patient will benefit from skilled therapeutic intervention in order to improve the following deficits and impairments:  Decreased range of motion, Increased fascial restricitons, Increased muscle spasms, Pain, Impaired flexibility, Improper body  mechanics, Postural dysfunction, Decreased strength  Visit Diagnosis: Cramp and spasm  Cervicalgia     Problem List Patient Active Problem List   Diagnosis Date Noted  . APS (antiphospholipid syndrome) (HCC) 04/09/2017  . Encounter for therapeutic drug monitoring 04/09/2017  . Long term (current) use of anticoagulants 04/09/2017  . Impaired mobility and activities of daily living 04/02/2017  . Cerebrovascular accident (CVA) due to embolism of right cerebellar artery (HCC) 03/28/2017  . Vertigo 03/27/2017  . Intermittent palpitations 11/25/2014  . Dyspnea on exertion 11/25/2014  . Chest pain of unknown etiology 11/25/2014    Kevonta Phariss,ANGIE PTA 01/08/2018, 1:21 PM  [facility] [place] [address] [place], [place], [address] Phone: [phone]   Fax:  [phone]  Name: Martha Burke MRN: [id number] Date of Birth: 1961-08-12

## 2018-01-11 ENCOUNTER — Ambulatory Visit: Payer: BLUE CROSS/BLUE SHIELD | Admitting: Physical Therapy

## 2018-01-11 ENCOUNTER — Encounter: Payer: Self-pay | Admitting: Physical Therapy

## 2018-01-11 DIAGNOSIS — R252 Cramp and spasm: Secondary | ICD-10-CM | POA: Diagnosis not present

## 2018-01-11 DIAGNOSIS — M542 Cervicalgia: Secondary | ICD-10-CM

## 2018-01-11 NOTE — Therapy (Signed)
[facility] [place] [address] [place], [place], [address] Phone: [phone]   Fax:  [phone]  Physical Therapy Treatment  Patient Details  Name: Martha Burke MRN: [id number] Date of Birth: 1961/03/12 Referring Provider: Everlena CooperJaffe   Encounter Date: 01/11/2018  PT End of Session - 01/11/18 1141    Visit Number  13    Date for PT Re-Evaluation  02/05/18    PT Start Time  1100    PT Stop Time  1150    PT Time Calculation (min)  50 min       Past Medical History:  Diagnosis Date  . APS (antiphospholipid syndrome) (HCC) 04/09/2017  . Cerebrovascular accident (CVA) due to embolism of right cerebellar artery (HCC) 03/28/2017  . Chest pain of unknown etiology 11/25/2014  . Decreased hearing, left   . Dyspnea on exertion 11/25/2014  . Encounter for therapeutic drug monitoring 04/09/2017  . Impaired mobility and activities of daily living 04/02/2017  . Intermittent palpitations 11/25/2014  . Long term (current) use of anticoagulants 04/09/2017  . Migraine aura without headache   . Migraine headache   . Palpitations   . Palpitations   . Post herpetic neuralgia   . Vertigo 03/27/2017    Past Surgical History:  Procedure Laterality Date  . TONSILLECTOMY      There were no vitals filed for this visit.  Subjective Assessment - 01/11/18 1139    Subjective  sore after last session but it helped and I am more aware to relax    Currently in Pain?  Yes    Pain Score  2                        [facility] Adult PT Treatment/Exercise - 01/11/18 0001      Moist Heat Therapy   Number Minutes Moist Heat  15 Minutes    Moist Heat Location  Cervical      Electrical Stimulation   Electrical Stimulation Location  right upper    Electrical Stimulation Action  IFC    Electrical Stimulation Parameters  supine    Electrical Stimulation Goals  Pain      Ultrasound   Ultrasound Location  RT UT/cerv/rhom    Ultrasound Parameters   COMBO      Manual Therapy   Manual Therapy  Soft tissue mobilization    Soft tissue mobilization  DSTW to RT UT/cerv and rhom               PT Short Term Goals - 11/15/17 1538      PT SHORT TERM GOAL #1   Title  independent with initial HEP    Status  Achieved        PT Long Term Goals - 01/01/18 1122      PT LONG TERM GOAL #1   Title  understand proper posture and body mechanics    Status  Achieved      PT LONG TERM GOAL #4   Title  reach bra with right hand without difficulty    Status  Achieved            Plan - 01/11/18 1142    Clinical Impression Statement  focus session on modalites and STW for trigger pts, significantly less tension with standing and sitting and verb able to monitor better to relax.    PT Treatment/Interventions  ADLs/Self Care Home Management;Cryotherapy;Electrical Stimulation;Moist Heat;Traction;Neuromuscular re-education;Therapeutic exercise;Therapeutic activities;Functional mobility training;Patient/family education;Manual techniques;Taping;Dry  needling    PT Next Visit Plan  assess and progress       Patient will benefit from skilled therapeutic intervention in order to improve the following deficits and impairments:  Decreased range of motion, Increased fascial restricitons, Increased muscle spasms, Pain, Impaired flexibility, Improper body mechanics, Postural dysfunction, Decreased strength  Visit Diagnosis: Cramp and spasm  Cervicalgia     Problem List Patient Active Problem List   Diagnosis Date Noted  . APS (antiphospholipid syndrome) (HCC) 04/09/2017  . Encounter for therapeutic drug monitoring 04/09/2017  . Long term (current) use of anticoagulants 04/09/2017  . Impaired mobility and activities of daily living 04/02/2017  . Cerebrovascular accident (CVA) due to embolism of right cerebellar artery (HCC) 03/28/2017  . Vertigo 03/27/2017  . Intermittent palpitations 11/25/2014  . Dyspnea on exertion 11/25/2014  .  Chest pain of unknown etiology 11/25/2014    PAYSEUR,ANGIE PTA 01/11/2018, 11:45 AM  [facility] [place] [address] [place], [place], [address] Phone: [phone]   Fax:  [phone]  Name: Martha Burke MRN: [id number] Date of Birth: 08/03/1961

## 2018-01-15 ENCOUNTER — Ambulatory Visit: Payer: BLUE CROSS/BLUE SHIELD | Admitting: Physical Therapy

## 2018-01-15 DIAGNOSIS — R252 Cramp and spasm: Secondary | ICD-10-CM | POA: Diagnosis not present

## 2018-01-15 DIAGNOSIS — M542 Cervicalgia: Secondary | ICD-10-CM

## 2018-01-15 NOTE — Therapy (Signed)
[facility] [place] [address] [place], [place], [address] Phone: [phone]   Fax:  [phone]  Physical Therapy Treatment  Patient Details  Name: Martha Burke MRN: [id number] Date of Birth: [birth year] Referring Provider: Everlena Cooper   Encounter Date: 01/15/2018  PT End of Session - 01/15/18 1136    Visit Number  14    Date for PT Re-Evaluation  02/05/18    PT Start Time  1100    PT Stop Time  1145    PT Time Calculation (min)  45 min       Past Medical History:  Diagnosis Date  . APS (antiphospholipid syndrome) (HCC) 04/09/2017  . Cerebrovascular accident (CVA) due to embolism of right cerebellar artery (HCC) 03/28/2017  . Chest pain of unknown etiology 11/25/2014  . Decreased hearing, left   . Dyspnea on exertion 11/25/2014  . Encounter for therapeutic drug monitoring 04/09/2017  . Impaired mobility and activities of daily living 04/02/2017  . Intermittent palpitations 11/25/2014  . Long term (current) use of anticoagulants 04/09/2017  . Migraine aura without headache   . Migraine headache   . Palpitations   . Palpitations   . Post herpetic neuralgia   . Vertigo 03/27/2017    Past Surgical History:  Procedure Laterality Date  . TONSILLECTOMY      There were no vitals filed for this visit.  Subjective Assessment - 01/15/18 1134    Subjective  tx is really helping    Currently in Pain?  Yes    Pain Score  2     Pain Location  Neck    Pain Orientation  Right                       [facility] Adult PT Treatment/Exercise - 01/15/18 0001      Moist Heat Therapy   Number Minutes Moist Heat  15 Minutes    Moist Heat Location  Cervical      Electrical Stimulation   Electrical Stimulation Location  right upper    Electrical Stimulation Action  IFC    Electrical Stimulation Parameters  supine    Electrical Stimulation Goals  Pain      Ultrasound   Ultrasound Location  rt UT/cerv/rhom    Ultrasound  Parameters  COMBO      Manual Therapy   Manual Therapy  Soft tissue mobilization    Soft tissue mobilization  DSTW to RT UT/cerv and rhom               PT Short Term Goals - 11/15/17 1538      PT SHORT TERM GOAL #1   Title  independent with initial HEP    Status  Achieved        PT Long Term Goals - 01/01/18 1122      PT LONG TERM GOAL #1   Title  understand proper posture and body mechanics    Status  Achieved      PT LONG TERM GOAL #4   Title  reach bra with right hand without difficulty    Status  Achieved            Plan - 01/15/18 1137    Clinical Impression Statement  decreased tightness and increased ROM. trigger pt into cerv extensors    PT Treatment/Interventions  ADLs/Self Care Home Management;Cryotherapy;Electrical Stimulation;Moist Heat;Traction;Neuromuscular re-education;Therapeutic exercise;Therapeutic activities;Functional mobility training;Patient/family education;Manual techniques;Taping;Dry needling    PT Next Visit Plan  assess  and progress       Patient will benefit from skilled therapeutic intervention in order to improve the following deficits and impairments:  Decreased range of motion, Increased fascial restricitons, Increased muscle spasms, Pain, Impaired flexibility, Improper body mechanics, Postural dysfunction, Decreased strength  Visit Diagnosis: Cramp and spasm  Cervicalgia     Problem List Patient Active Problem List   Diagnosis Date Noted  . APS (antiphospholipid syndrome) (HCC) 04/09/2017  . Encounter for therapeutic drug monitoring 04/09/2017  . Long term (current) use of anticoagulants 04/09/2017  . Impaired mobility and activities of daily living 04/02/2017  . Cerebrovascular accident (CVA) due to embolism of right cerebellar artery (HCC) 03/28/2017  . Vertigo 03/27/2017  . Intermittent palpitations 11/25/2014  . Dyspnea on exertion 11/25/2014  . Chest pain of unknown etiology 11/25/2014    Dyneisha Murchison,ANGIE  PTA 01/15/2018, 11:41 AM  [facility] [place] [address] [place], [place], [address] Phone: [phone]   Fax:  [phone]  Name: Martha Burke MRN: [id number] Date of Birth: August 28, 1961

## 2018-01-18 ENCOUNTER — Ambulatory Visit: Payer: BLUE CROSS/BLUE SHIELD | Attending: Neurology | Admitting: Physical Therapy

## 2018-01-18 DIAGNOSIS — R252 Cramp and spasm: Secondary | ICD-10-CM | POA: Diagnosis present

## 2018-01-18 DIAGNOSIS — M542 Cervicalgia: Secondary | ICD-10-CM | POA: Insufficient documentation

## 2018-01-18 NOTE — Therapy (Signed)
[facility] [place] [facility] [place] [address] [place], [place], [address] Phone: [phone]   Fax:  [phone]  Physical Therapy Treatment  Patient Details  Name: Ragan Duhon MRN: [id number] Date of Birth: Aug 24, 1961 Referring Provider: Everlena Cooper   Encounter Date: 01/18/2018  PT End of Session - 01/18/18 1001    Visit Number  15    Date for PT Re-Evaluation  02/05/18    PT Start Time  0930    PT Stop Time  1010    PT Time Calculation (min)  40 min       Past Medical History:  Diagnosis Date  . APS (antiphospholipid syndrome) (HCC) 04/09/2017  . Cerebrovascular accident (CVA) due to embolism of right cerebellar artery (HCC) 03/28/2017  . Chest pain of unknown etiology 11/25/2014  . Decreased hearing, left   . Dyspnea on exertion 11/25/2014  . Encounter for therapeutic drug monitoring 04/09/2017  . Impaired mobility and activities of daily living 04/02/2017  . Intermittent palpitations 11/25/2014  . Long term (current) use of anticoagulants 04/09/2017  . Migraine aura without headache   . Migraine headache   . Palpitations   . Palpitations   . Post herpetic neuralgia   . Vertigo 03/27/2017    Past Surgical History:  Procedure Laterality Date  . TONSILLECTOMY      There were no vitals filed for this visit.  Subjective Assessment - 01/18/18 0958    Subjective  optical HA this morning    Currently in Pain?  Yes    Pain Score  3     Pain Location  Neck                       [facility] Adult PT Treatment/Exercise - 01/18/18 0001      Moist Heat Therapy   Number Minutes Moist Heat  15 Minutes    Moist Heat Location  Cervical      Electrical Stimulation   Electrical Stimulation Location  right upper    Electrical Stimulation Action  IFFC    Electrical Stimulation Parameters  supine    Electrical Stimulation Goals  Pain      Manual Therapy   Manual Therapy  Soft tissue mobilization;Myofascial release;Passive ROM    Soft tissue mobilization  DSTW to RT UT/cerv and rhom    Myofascial Release  Cupping    Passive ROM  with cerv SNAGS and NAGS             PT Education - 01/18/18 1001    Education provided  Yes    Education Details  educ on cupping     Person(s) Educated  Patient    Methods  Explanation    Comprehension  Verbalized understanding       PT Short Term Goals - 11/15/17 1538      PT SHORT TERM GOAL #1   Title  independent with initial HEP    Status  Achieved        PT Long Term Goals - 01/01/18 1122      PT LONG TERM GOAL #1   Title  understand proper posture and body mechanics    Status  Achieved      PT LONG TERM GOAL #4   Title  reach bra with right hand without difficulty    Status  Achieved            Plan - 01/18/18 1002    Clinical Impression Statement  pt  responded well to cupping,mild redness in trap from cup but minimal time left on d/t blood thinner . pt visably more relaxed and not able ot monitor and realx with tension, still trigger pts presnt    PT Treatment/Interventions  ADLs/Self Care Home Management;Cryotherapy;Electrical Stimulation;Moist Heat;Traction;Neuromuscular re-education;Therapeutic exercise;Therapeutic activities;Functional mobility training;Patient/family education;Manual techniques;Taping;Dry needling    PT Next Visit Plan  assess and progress ( asked PT to add add'l LTGS)       Patient will benefit from skilled therapeutic intervention in order to improve the following deficits and impairments:  Decreased range of motion, Increased fascial restricitons, Increased muscle spasms, Pain, Impaired flexibility, Improper body mechanics, Postural dysfunction, Decreased strength  Visit Diagnosis: Cramp and spasm  Cervicalgia     Problem List Patient Active Problem List   Diagnosis Date Noted  . APS (antiphospholipid syndrome) (HCC) 04/09/2017  . Encounter for therapeutic drug monitoring 04/09/2017  . Long term (current) use of  anticoagulants 04/09/2017  . Impaired mobility and activities of daily living 04/02/2017  . Cerebrovascular accident (CVA) due to embolism of right cerebellar artery (HCC) 03/28/2017  . Vertigo 03/27/2017  . Intermittent palpitations 11/25/2014  . Dyspnea on exertion 11/25/2014  . Chest pain of unknown etiology 11/25/2014    Leafy Motsinger,ANGIE PTA 01/18/2018, 10:05 AM  [facility] [place] [address] [place], [place], [address] Phone: [phone]   Fax:  [phone]  Name: Posey Jasmin MRN: [id number] Date of Birth: 1960-11-03

## 2018-01-23 ENCOUNTER — Encounter: Payer: Self-pay | Admitting: Physical Therapy

## 2018-01-23 ENCOUNTER — Ambulatory Visit: Payer: BLUE CROSS/BLUE SHIELD | Admitting: Physical Therapy

## 2018-01-23 DIAGNOSIS — R252 Cramp and spasm: Secondary | ICD-10-CM | POA: Diagnosis not present

## 2018-01-23 DIAGNOSIS — M542 Cervicalgia: Secondary | ICD-10-CM

## 2018-01-23 NOTE — Therapy (Signed)
[facility] [place] [address] [place], [place], [address] Phone: [phone]   Fax:  [phone]  Physical Therapy Treatment  Patient Details  Name: Martha Burke MRN: [id number] Date of Birth: 1961-07-09 Referring Provider: Everlena Cooper   Encounter Date: 01/23/2018  PT End of Session - 01/23/18 1342    Visit Number  16    Date for PT Re-Evaluation  02/05/18    PT Start Time  1235    PT Stop Time  1330    PT Time Calculation (min)  55 min    Activity Tolerance  Patient tolerated treatment well    Behavior During Therapy  [facility] for tasks assessed/performed       Past Medical History:  Diagnosis Date  . APS (antiphospholipid syndrome) (HCC) 04/09/2017  . Cerebrovascular accident (CVA) due to embolism of right cerebellar artery (HCC) 03/28/2017  . Chest pain of unknown etiology 11/25/2014  . Decreased hearing, left   . Dyspnea on exertion 11/25/2014  . Encounter for therapeutic drug monitoring 04/09/2017  . Impaired mobility and activities of daily living 04/02/2017  . Intermittent palpitations 11/25/2014  . Long term (current) use of anticoagulants 04/09/2017  . Migraine aura without headache   . Migraine headache   . Palpitations   . Palpitations   . Post herpetic neuralgia   . Vertigo 03/27/2017    Past Surgical History:  Procedure Laterality Date  . TONSILLECTOMY      There were no vitals filed for this visit.  Subjective Assessment - 01/23/18 1332    Subjective  Pt arriving to threapy today reporting 2-3/10 R upper trap pain.     Pain Score  3     Pain Location  Neck    Pain Orientation  Right    Pain Type  Acute pain                       [facility] Adult PT Treatment/Exercise - 01/23/18 0001      Neck Exercises: Standing   Upper Extremity D1  10 reps;Theraband    Theraband Level (UE D1)  Level 2 (Red)    Upper Extremity D2  10 reps;Theraband    Theraband Level (UE D2)  Level 2 (Red)    Other Standing  Exercises  wall angles x 10 reps with verbal instructions for positioning and technique      Moist Heat Therapy   Number Minutes Moist Heat  15 Minutes    Moist Heat Location  Cervical      Electrical Stimulation   Electrical Stimulation Location  right upper    Electrical Stimulation Action  IFC    Electrical Stimulation Parameters  supine    Electrical Stimulation Goals  Pain      Manual Therapy   Manual Therapy  Soft tissue mobilization;Myofascial release;Passive ROM    Soft tissue mobilization  DSTW to RT UT/cerv and rhom             PT Education - 01/23/18 1332    Education provided  Yes    Education Details  posture correction exercises    Person(s) Educated  Patient    Methods  Demonstration;Explanation    Comprehension  Verbalized understanding;Returned demonstration       PT Short Term Goals - 11/15/17 1538      PT SHORT TERM GOAL #1   Title  independent with initial HEP    Status  Achieved  PT Long Term Goals - 01/23/18 1335      PT LONG TERM GOAL #1   Title  understand proper posture and body mechanics    Status  Achieved      PT LONG TERM GOAL #2   Title  decrease pain 50%    Status  Achieved      PT LONG TERM GOAL #3   Title  increase cervical ROM 25%      PT LONG TERM GOAL #4   Time  8    Period  Weeks    Status  Achieved      PT LONG TERM GOAL #5   Title  decrease trigger points and tension in the cervical spine 50%    Time  5    Period  Weeks    Status  New      PT LONG TERM GOAL #6   Title  painfree end ROM for cervical spine for easy driving    Time  5    Period  Weeks    Status  New            Plan - 01/23/18 1333    Clinical Impression Statement  Pt responded well to treatment today. Pt feels STW is helping with decreased pain and mobility. Still Active trigger points noted in R Upper Trap and along medial scapular border, but less pain reported.     Rehab Potential  Good    PT Frequency  2x / week    PT  Duration  8 weeks    PT Treatment/Interventions  ADLs/Self Care Home Management;Cryotherapy;Electrical Stimulation;Moist Heat;Traction;Neuromuscular re-education;Therapeutic exercise;Therapeutic activities;Functional mobility training;Patient/family education;Manual techniques;Taping;Dry needling    PT Next Visit Plan  consider cupping again vs TPDN, continue with STW and exercises    Consulted and Agree with Plan of Care  Patient       Patient will benefit from skilled therapeutic intervention in order to improve the following deficits and impairments:  Decreased range of motion, Increased fascial restricitons, Increased muscle spasms, Pain, Impaired flexibility, Improper body mechanics, Postural dysfunction, Decreased strength  Visit Diagnosis: Cramp and spasm  Cervicalgia     Problem List Patient Active Problem List   Diagnosis Date Noted  . APS (antiphospholipid syndrome) (HCC) 04/09/2017  . Encounter for therapeutic drug monitoring 04/09/2017  . Long term (current) use of anticoagulants 04/09/2017  . Impaired mobility and activities of daily living 04/02/2017  . Cerebrovascular accident (CVA) due to embolism of right cerebellar artery (HCC) 03/28/2017  . Vertigo 03/27/2017  . Intermittent palpitations 11/25/2014  . Dyspnea on exertion 11/25/2014  . Chest pain of unknown etiology 11/25/2014    Sharmon Leyden, MPT 01/23/2018, 1:45 PM  [facility] [place] [address] [place], [place], [address] Phone: [phone]   Fax:  [phone]  Name: Marylynn Rigdon MRN: [id number] Date of Birth: 1961/04/13

## 2018-01-24 ENCOUNTER — Encounter: Payer: Self-pay | Admitting: Physical Therapy

## 2018-01-24 ENCOUNTER — Ambulatory Visit: Payer: BLUE CROSS/BLUE SHIELD | Admitting: Physical Therapy

## 2018-01-24 DIAGNOSIS — R252 Cramp and spasm: Secondary | ICD-10-CM

## 2018-01-24 DIAGNOSIS — M542 Cervicalgia: Secondary | ICD-10-CM

## 2018-01-24 NOTE — Therapy (Signed)
[facility] [place] [place], [place], [address] Phone: [phone]   Fax:  [phone]  Physical Therapy Treatment  Patient Details  Name: Martha Burke MRN: [id number] Date of Birth: Sep 05, 1961 Referring Provider: Tomi Likens   Encounter Date: 01/24/2018  PT End of Session - 01/24/18 1658    Visit Number  17    Date for PT Re-Evaluation  02/05/18    PT Start Time  7544    PT Stop Time  9201    PT Time Calculation (min)  60 min       Past Medical History:  Diagnosis Date  . APS (antiphospholipid syndrome) ([place]) 04/09/2017  . Cerebrovascular accident (CVA) due to embolism of right cerebellar artery ([place]) 03/28/2017  . Chest pain of unknown etiology 11/25/2014  . Decreased hearing, left   . Dyspnea on exertion 11/25/2014  . Encounter for therapeutic drug monitoring 04/09/2017  . Impaired mobility and activities of daily living 04/02/2017  . Intermittent palpitations 11/25/2014  . Long term (current) use of anticoagulants 04/09/2017  . Migraine aura without headache   . Migraine headache   . Palpitations   . Palpitations   . Post herpetic neuralgia   . Vertigo 03/27/2017    Past Surgical History:  Procedure Laterality Date  . TONSILLECTOMY      There were no vitals filed for this visit.  Subjective Assessment - 01/24/18 1616    Subjective  85% better over all    Currently in Pain?  No/denies                       [facility] Adult PT Treatment/Exercise - 01/24/18 0001      Neck Exercises: Machines for Strengthening   UBE (Upper Arm Bike)  2 fwd / 2 back    Power Tower  10# cable pulley scab stab      Neck Exercises: Standing   Other Standing Exercises  head on ball cerv retraction with 3# UE wt multi stab ex      Moist Heat Therapy   Number Minutes Moist Heat  15 Minutes    Moist Heat Location  Cervical      Electrical Stimulation   Electrical Stimulation Location  right upper    Electrical  Stimulation Action  IFC    Electrical Stimulation Parameters  supine    Electrical Stimulation Goals  Pain      Manual Therapy   Manual Therapy  Soft tissue mobilization;Myofascial release    Soft tissue mobilization  DSTW to RT UT/cerv and rhom    Myofascial Release  RT UT/cerv and rhomb- cupping    Kinesiotex  Create Space rhomboid             PT Education - 01/23/18 1332    Education provided  Yes    Education Details  posture correction exercises    Person(s) Educated  Patient    Methods  Demonstration;Explanation    Comprehension  Verbalized understanding;Returned demonstration       PT Short Term Goals - 11/15/17 1538      PT SHORT TERM GOAL #1   Title  independent with initial HEP    Status  Achieved        PT Long Term Goals - 01/24/18 1619      PT LONG TERM GOAL #1   Title  understand proper posture and body mechanics    Status  Achieved  PT LONG TERM GOAL #2   Title  decrease pain 50%    Status  Achieved      PT LONG TERM GOAL #3   Title  increase cervical ROM 25%    Status  Achieved      PT LONG TERM GOAL #4   Title  reach bra with right hand without difficulty    Status  Achieved      PT LONG TERM GOAL #5   Title  decrease trigger points and tension in the cervical spine 50%    Status  Partially Met      PT LONG TERM GOAL #6   Title  painfree end ROM for cervical spine for easy driving    Status  Partially Met            Plan - 01/24/18 1620    Clinical Impression Statement  overall pain 85 % better still verb compensations and tension in shld. pt responding well to STW. cuing with stab ex today. pt verb pt states righting reactions improving too for LOB. progressing with remaining goals.    PT Treatment/Interventions  ADLs/Self Care Home Management;Cryotherapy;Electrical Stimulation;Moist Heat;Traction;Neuromuscular re-education;Therapeutic exercise;Therapeutic activities;Functional mobility training;Patient/family  education;Manual techniques;Taping;Dry needling    PT Next Visit Plan  consider cupping again vs TPDN, continue with STW and exercises       Patient will benefit from skilled therapeutic intervention in order to improve the following deficits and impairments:  Decreased range of motion, Increased fascial restricitons, Increased muscle spasms, Pain, Impaired flexibility, Improper body mechanics, Postural dysfunction, Decreased strength  Visit Diagnosis: Cramp and spasm  Cervicalgia     Problem List Patient Active Problem List   Diagnosis Date Noted  . APS (antiphospholipid syndrome) ([place]) 04/09/2017  . Encounter for therapeutic drug monitoring 04/09/2017  . Long term (current) use of anticoagulants 04/09/2017  . Impaired mobility and activities of daily living 04/02/2017  . Cerebrovascular accident (CVA) due to embolism of right cerebellar artery ([place]) 03/28/2017  . Vertigo 03/27/2017  . Intermittent palpitations 11/25/2014  . Dyspnea on exertion 11/25/2014  . Chest pain of unknown etiology 11/25/2014    PAYSEUR,ANGIE PTA 01/24/2018, 4:59 PM  [place] [address] [place], [place], [address] Phone: [phone]   Fax:  [phone]  Name: Martha Burke MRN: [id number] Date of Birth: Dec 04, 1960

## 2018-01-28 ENCOUNTER — Ambulatory Visit: Payer: Self-pay | Admitting: Podiatry

## 2018-01-29 ENCOUNTER — Ambulatory Visit: Payer: BLUE CROSS/BLUE SHIELD | Admitting: Physical Therapy

## 2018-01-29 DIAGNOSIS — M542 Cervicalgia: Secondary | ICD-10-CM

## 2018-01-29 DIAGNOSIS — R252 Cramp and spasm: Secondary | ICD-10-CM

## 2018-01-29 NOTE — Therapy (Signed)
[facility] [place], [place], [address] Phone: [phone]   Fax:  [phone]  Physical Therapy Treatment  Patient Details  Name: Martha Burke MRN: [id number] Date of Birth: 1960-12-06 Referring Provider: Tomi Likens   Encounter Date: 01/29/2018  PT End of Session - 01/29/18 1228    Visit Number  18    Date for PT Re-Evaluation  02/05/18    PT Start Time  6962    PT Stop Time  1230    PT Time Calculation (min)  45 min       Past Medical History:  Diagnosis Date  . APS (antiphospholipid syndrome) ([place]) 04/09/2017  . Cerebrovascular accident (CVA) due to embolism of right cerebellar artery ([place]) 03/28/2017  . Chest pain of unknown etiology 11/25/2014  . Decreased hearing, left   . Dyspnea on exertion 11/25/2014  . Encounter for therapeutic drug monitoring 04/09/2017  . Impaired mobility and activities of daily living 04/02/2017  . Intermittent palpitations 11/25/2014  . Long term (current) use of anticoagulants 04/09/2017  . Migraine aura without headache   . Migraine headache   . Palpitations   . Palpitations   . Post herpetic neuralgia   . Vertigo 03/27/2017    Past Surgical History:  Procedure Laterality Date  . TONSILLECTOMY      There were no vitals filed for this visit.  Subjective Assessment - 01/29/18 1146    Subjective  doing well    Currently in Pain?  Yes    Pain Score  1     Pain Location  Neck    Pain Orientation  Right                       [facility] Adult PT Treatment/Exercise - 01/29/18 0001      Neck Exercises: Machines for Strengthening   UBE (Upper Arm Bike)  3 fwd/3 back L 3      Neck Exercises: Standing   Other Standing Exercises  standing on airex 4# UE ex      Moist Heat Therapy   Number Minutes Moist Heat  15 Minutes    Moist Heat Location  Cervical      Electrical Stimulation   Electrical Stimulation Location  right upper    Electrical Stimulation Action   IFC    Electrical Stimulation Parameters  supine    Electrical Stimulation Goals  Pain      Manual Therapy   Manual Therapy  Soft tissue mobilization               PT Short Term Goals - 11/15/17 1538      PT SHORT TERM GOAL #1   Title  independent with initial HEP    Status  Achieved        PT Long Term Goals - 01/24/18 1619      PT LONG TERM GOAL #1   Title  understand proper posture and body mechanics    Status  Achieved      PT LONG TERM GOAL #2   Title  decrease pain 50%    Status  Achieved      PT LONG TERM GOAL #3   Title  increase cervical ROM 25%    Status  Achieved      PT LONG TERM GOAL #4   Title  reach bra with right hand without difficulty    Status  Achieved      PT LONG  TERM GOAL #5   Title  decrease trigger points and tension in the cervical spine 50%    Status  Partially Met      PT LONG TERM GOAL #6   Title  painfree end ROM for cervical spine for easy driving    Status  Partially Met            Plan - 01/29/18 1228    Clinical Impression Statement  making excellent progress- discussed stress mang tech to aviod flair ups    PT Treatment/Interventions  ADLs/Self Care Home Management;Cryotherapy;Electrical Stimulation;Moist Heat;Traction;Neuromuscular re-education;Therapeutic exercise;Therapeutic activities;Functional mobility training;Patient/family education;Manual techniques;Taping;Dry needling    PT Next Visit Plan  D/C       Patient will benefit from skilled therapeutic intervention in order to improve the following deficits and impairments:  Decreased range of motion, Increased fascial restricitons, Increased muscle spasms, Pain, Impaired flexibility, Improper body mechanics, Postural dysfunction, Decreased strength  Visit Diagnosis: Cramp and spasm  Cervicalgia     Problem List Patient Active Problem List   Diagnosis Date Noted  . APS (antiphospholipid syndrome) ([place]) 04/09/2017  . Encounter for therapeutic drug  monitoring 04/09/2017  . Long term (current) use of anticoagulants 04/09/2017  . Impaired mobility and activities of daily living 04/02/2017  . Cerebrovascular accident (CVA) due to embolism of right cerebellar artery ([place]) 03/28/2017  . Vertigo 03/27/2017  . Intermittent palpitations 11/25/2014  . Dyspnea on exertion 11/25/2014  . Chest pain of unknown etiology 11/25/2014    [facility]  PTA [place] [address] [place], [place], [address] Phone: [phone]   Fax:  [phone]  Name: Martha Burke MRN: [id number] Date of Birth: [birth year]

## 2018-01-31 ENCOUNTER — Ambulatory Visit: Payer: BLUE CROSS/BLUE SHIELD | Admitting: Physical Therapy

## 2018-01-31 DIAGNOSIS — R252 Cramp and spasm: Secondary | ICD-10-CM | POA: Diagnosis not present

## 2018-01-31 DIAGNOSIS — M542 Cervicalgia: Secondary | ICD-10-CM

## 2018-01-31 NOTE — Therapy (Signed)
[place], [place], [address] Phone: [phone]   Fax:  [phone]  Physical Therapy Treatment  Patient Details  Name: Martha Burke MRN: [id number] Date of Birth: 1961-05-09 Referring Provider: Tomi Likens   Encounter Date: 01/31/2018  PT End of Session - 01/31/18 1227    Visit Number  19    Date for PT Re-Evaluation  02/05/18    PT Start Time  2409    PT Stop Time  7353    PT Time Calculation (min)  47 min       Past Medical History:  Diagnosis Date  . APS (antiphospholipid syndrome) ([place]) 04/09/2017  . Cerebrovascular accident (CVA) due to embolism of right cerebellar artery ([place]) 03/28/2017  . Chest pain of unknown etiology 11/25/2014  . Decreased hearing, left   . Dyspnea on exertion 11/25/2014  . Encounter for therapeutic drug monitoring 04/09/2017  . Impaired mobility and activities of daily living 04/02/2017  . Intermittent palpitations 11/25/2014  . Long term (current) use of anticoagulants 04/09/2017  . Migraine aura without headache   . Migraine headache   . Palpitations   . Palpitations   . Post herpetic neuralgia   . Vertigo 03/27/2017    Past Surgical History:  Procedure Laterality Date  . TONSILLECTOMY      There were no vitals filed for this visit.  Subjective Assessment - 01/31/18 1150    Subjective  migrane but other than that doing well    Currently in Pain?  Yes    Pain Score  1     Pain Location  Neck                       [facility] Adult PT Treatment/Exercise - 01/31/18 0001      Neck Exercises: Machines for Strengthening   UBE (Upper Arm Bike)  3 fwd/3 back L 3    Power Tower  10# cable pulley scab stab      Neck Exercises: Standing   Other Standing Exercises  ball vs wall 5 each way    Other Standing Exercises  3# UE ex 10 each      Moist Heat Therapy   Number Minutes Moist Heat  15 Minutes    Moist Heat Location  Cervical      Electrical  Stimulation   Electrical Stimulation Location  right upper    Electrical Stimulation Action  I FC    Electrical Stimulation Parameters  supine    Electrical Stimulation Goals  Pain      Manual Therapy   Manual Therapy  Soft tissue mobilization               PT Short Term Goals - 11/15/17 1538      PT SHORT TERM GOAL #1   Title  independent with initial HEP    Status  Achieved        PT Long Term Goals - 01/31/18 1158      PT LONG TERM GOAL #1   Title  understand proper posture and body mechanics    Status  Achieved      PT LONG TERM GOAL #2   Title  decrease pain 50%    Status  Achieved      PT LONG TERM GOAL #3   Title  increase cervical ROM 25%    Status  Achieved      PT LONG TERM GOAL #4  Title  reach bra with right hand without difficulty    Status  Achieved      PT LONG TERM GOAL #5   Status  Achieved      PT LONG TERM GOAL #6   Title  painfree end ROM for cervical spine for easy driving    Status  Achieved            Plan - 01/31/18 1227    Clinical Impression Statement  all goals met    PT Treatment/Interventions  ADLs/Self Care Home Management;Cryotherapy;Electrical Stimulation;Moist Heat;Traction;Neuromuscular re-education;Therapeutic exercise;Therapeutic activities;Functional mobility training;Patient/family education;Manual techniques;Taping;Dry needling    PT Next Visit Plan  D/C       Patient will benefit from skilled therapeutic intervention in order to improve the following deficits and impairments:  Decreased range of motion, Increased fascial restricitons, Increased muscle spasms, Pain, Impaired flexibility, Improper body mechanics, Postural dysfunction, Decreased strength  Visit Diagnosis: Cramp and spasm  Cervicalgia     Problem List Patient Active Problem List   Diagnosis Date Noted  . APS (antiphospholipid syndrome) ([place]) 04/09/2017  . Encounter for therapeutic drug monitoring 04/09/2017  . Long term (current)  use of anticoagulants 04/09/2017  . Impaired mobility and activities of daily living 04/02/2017  . Cerebrovascular accident (CVA) due to embolism of right cerebellar artery ([place]) 03/28/2017  . Vertigo 03/27/2017  . Intermittent palpitations 11/25/2014  . Dyspnea on exertion 11/25/2014  . Chest pain of unknown etiology 11/25/2014  PHYSICAL THERAPY DISCHARGE SUMMARY   Plan: Patient agrees to discharge.  Patient goals were met. Patient is being discharged due to meeting the stated rehab goals.  ?????       PAYSEUR,ANGIE PTA 01/31/2018, 12:28 PM  [facility] [address] [place], [place], [address] Phone: [phone]   Fax:  [phone]  Name: Martha Burke MRN: [id number] Date of Birth: Jan 08, 1961

## 2018-04-12 DIAGNOSIS — B0223 Postherpetic polyneuropathy: Secondary | ICD-10-CM | POA: Insufficient documentation

## 2018-04-14 DIAGNOSIS — H90A32 Mixed conductive and sensorineural hearing loss, unilateral, left ear with restricted hearing on the contralateral side: Secondary | ICD-10-CM | POA: Insufficient documentation

## 2018-04-14 DIAGNOSIS — H90A21 Sensorineural hearing loss, unilateral, right ear, with restricted hearing on the contralateral side: Secondary | ICD-10-CM | POA: Insufficient documentation

## 2019-04-01 IMAGING — CR DG CHEST 2V
2 series · 2 of 2 positions shown · non-contrast
Comparison: None.

CLINICAL DATA: Coughing, some shortness of breath over the last 5
months

EXAM:
CHEST  2 VIEW

[w chest pa]
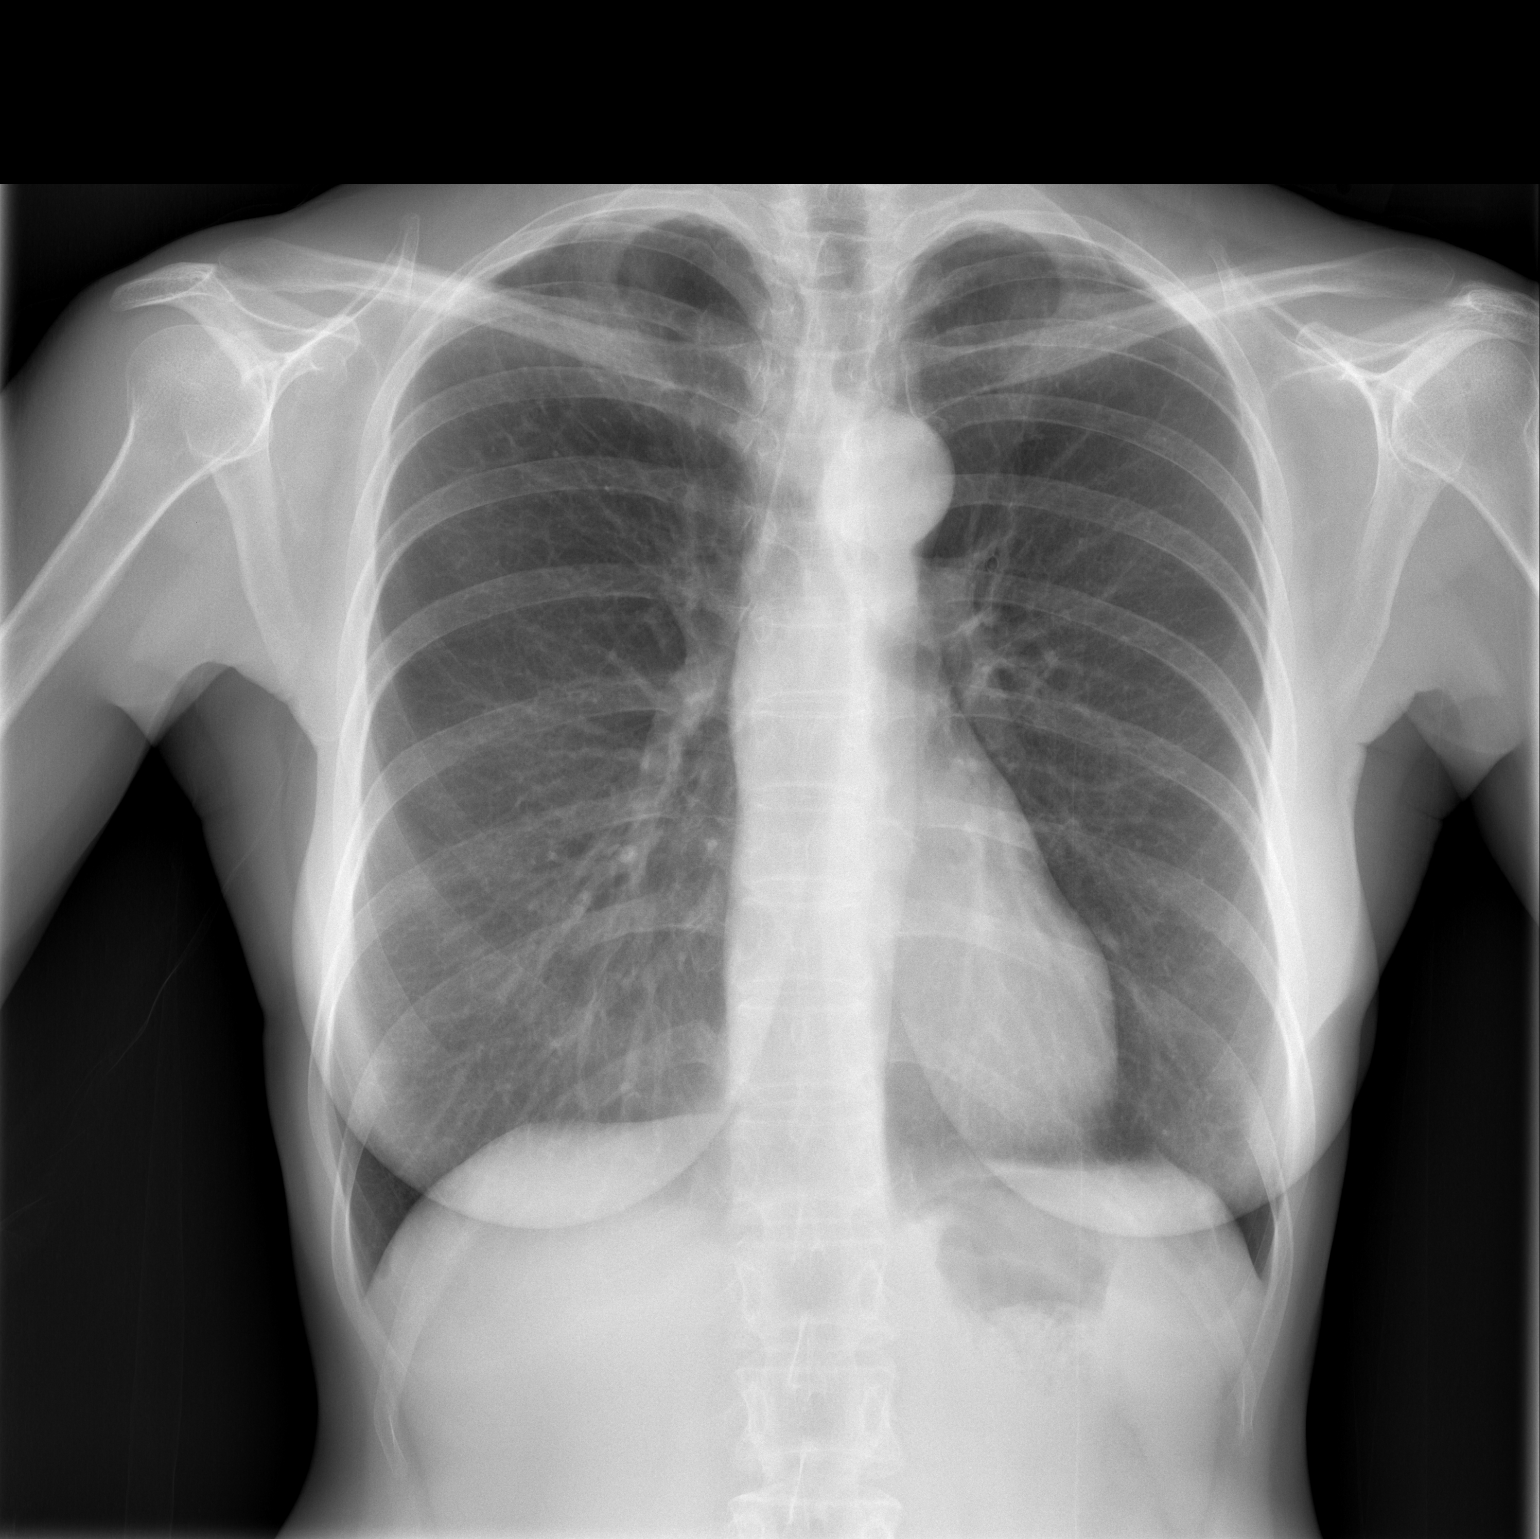

[w chest lat]
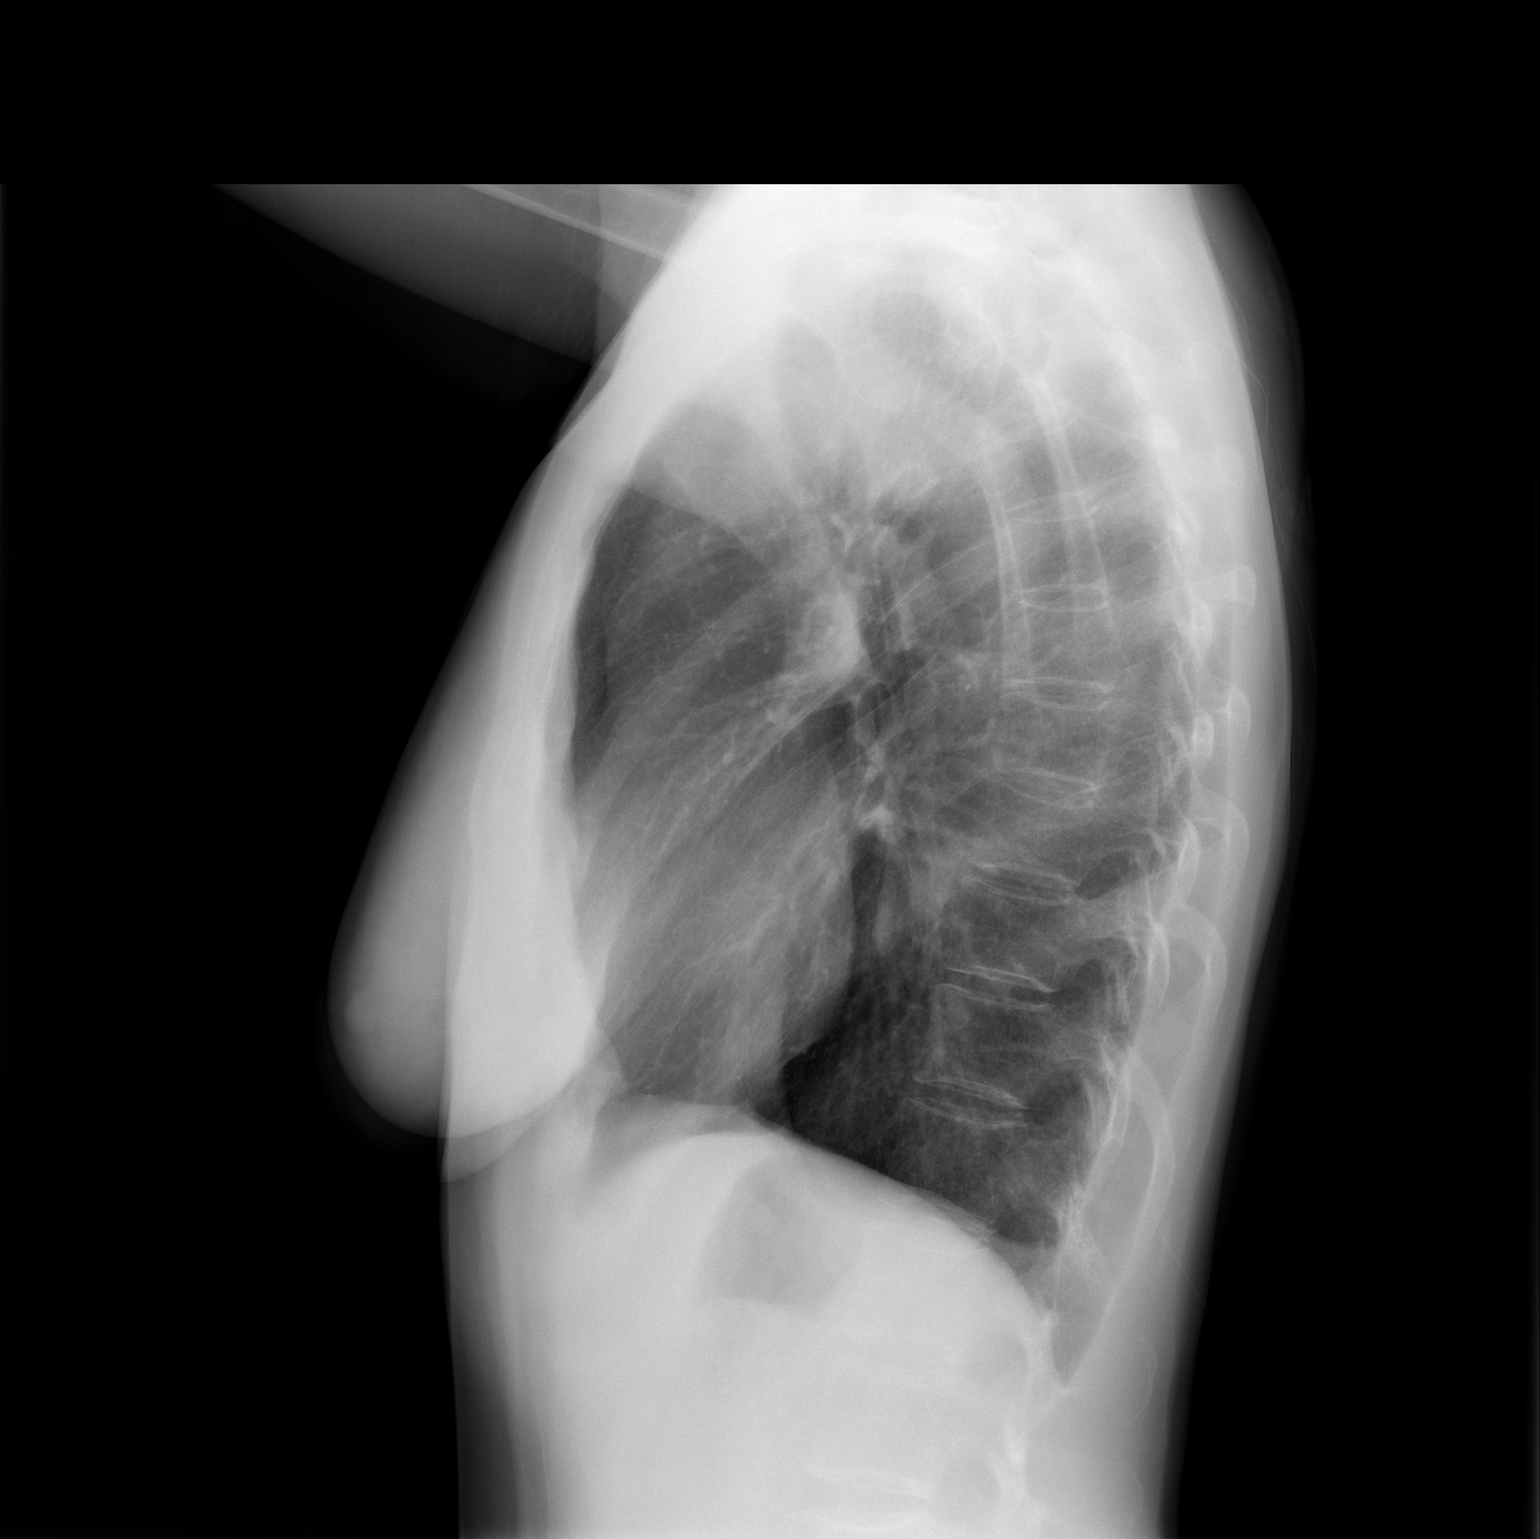

[2 of 2 positions shown; findings below may reference images not displayed]

FINDINGS: No active infiltrate or effusion is seen. Mediastinal and hilar
contours are unremarkable. The heart is within normal limits in
size. No bony abnormality is seen
IMPRESSION: No active cardiopulmonary disease.

## 2022-02-08 ENCOUNTER — Other Ambulatory Visit: Payer: Self-pay | Admitting: Obstetrics and Gynecology

## 2022-02-08 DIAGNOSIS — Z1231 Encounter for screening mammogram for malignant neoplasm of breast: Secondary | ICD-10-CM

## 2022-03-30 ENCOUNTER — Ambulatory Visit: Payer: Self-pay | Admitting: *Deleted

## 2022-03-30 ENCOUNTER — Ambulatory Visit
Admission: RE | Admit: 2022-03-30 | Discharge: 2022-03-30 | Disposition: A | Discharge status: Home / Self Care | Payer: No Typology Code available for payment source | Source: Ambulatory Visit | Attending: Obstetrics and Gynecology | Admitting: Obstetrics and Gynecology

## 2022-03-30 VITALS — BP 138/90 | Wt 124.9 lb

## 2022-03-30 DIAGNOSIS — Z1231 Encounter for screening mammogram for malignant neoplasm of breast: Secondary | ICD-10-CM

## 2022-03-30 DIAGNOSIS — Z01419 Encounter for gynecological examination (general) (routine) without abnormal findings: Secondary | ICD-10-CM

## 2022-03-30 NOTE — Patient Instructions (Signed)
Explained breast self awareness with [name]. Pap smear completed today. Let patient know if today's Pap smear is normal and HPV negative that her next Pap smear will be due in one year due to her history of multiple abnormal Pap smears. Referred patient to the [facility] for a screening mammogram on mobile unit. Appointment scheduled Thursday, March 30, 2022 at 1140. Patient aware of appointment and will be there. Let patient know will follow up with her within the next couple weeks with results of Pap smear by phone. Informed patient that the Breast Center will follow up with her within the next couple of weeks with results of her mammogram by letter or phone. Martha Burke verbalized understanding.  Anisa Leanos, Kathaleen Maser, RN 11:02 AM

## 2022-03-30 NOTE — Progress Notes (Signed)
Martha Burke is a 61 y.o. No obstetric history on file. female who presents to [facility] clinic today with no complaints.    Pap Smear:  Pap smear completed today. Last Pap smear was 03/22/2016 at [facility] clinic and was abnormal - ASCUS with negative HPV . Per patient has history of multiple abnormal Pap smears over the past 15 years. Patient states that has had one colposcopy completed 8-10 years ago for follow up. Last Pap smear result is available in Epic under [facility].   Physical exam: Breasts Breasts symmetrical. No skin abnormalities bilateral breasts. No nipple retraction bilateral breasts. No nipple discharge bilateral breasts. No lymphadenopathy. No lumps palpated bilateral breasts. No complaints of pain or tenderness on exam.   Pelvic/Bimanual Ext Genitalia No lesions, no swelling and no discharge observed on external genitalia.        Vagina Vagina pink and normal texture. No lesions or discharge observed in vagina.        Cervix Cervix is present. Cervix pink and of normal texture. No discharge observed.    Uterus Uterus is present and palpable. Uterus in normal position and normal size.        Adnexae Bilateral ovaries present and palpable. No tenderness on palpation.         Rectovaginal No rectal exam completed today since patient had no rectal complaints. No skin abnormalities observed on exam.     Smoking History: Patient has never smoked.   Patient Navigation: Patient education provided. Access to services provided for patient through [facility] program.   Colorectal Cancer Screening: Per patient has never had colonoscopy completed. Patient stated her PCP gave her a FIT Test in June 2023 and will complete it. No complaints today.    Breast and Cervical Cancer Risk Assessment: Patient does not have family history of breast cancer, known genetic mutations, or radiation treatment to the chest before age 70. Per patient has history of cervical dysplasia.  Patient has no history of being immunocompromised or DES exposure in-utero.  Risk Assessment     Risk Scores       03/30/2022   Last edited by: Narda Rutherford, LPN   5-year risk: 1.3 %   Lifetime risk: 6.6 %            A: [facility] exam with pap smear No complaints.  P: Referred patient to the [facility] for a screening mammogram on mobile unit. Appointment scheduled Thursday, March 30, 2022 at 1140.  Priscille Heidelberg, RN 03/30/2022 11:02 AM

## 2022-04-06 LAB — CYTOLOGY - PAP
Comment: NEGATIVE
High risk HPV: NEGATIVE

## 2022-04-07 ENCOUNTER — Telehealth: Payer: Self-pay

## 2022-04-07 NOTE — Telephone Encounter (Signed)
Patient informed pap results, AGUS with negative HPV, Dr. Jolayne Panther has recommended an endometrial biopsy, will discuss financial coverage with Fonnie Mu, RN [facility] Program Manager) upon her return on 04/10/2022. Patient verbalized understanding.

## 2022-04-07 NOTE — Telephone Encounter (Signed)
Attempted to contact patient regarding lab results. Left message on identifying voicemail requesting a return call.  

## 2022-05-18 ENCOUNTER — Ambulatory Visit (INDEPENDENT_AMBULATORY_CARE_PROVIDER_SITE_OTHER): Payer: Self-pay | Admitting: Family Medicine

## 2022-05-18 ENCOUNTER — Other Ambulatory Visit: Payer: Self-pay

## 2022-05-18 ENCOUNTER — Encounter: Payer: Self-pay | Admitting: Family Medicine

## 2022-05-18 ENCOUNTER — Other Ambulatory Visit (HOSPITAL_COMMUNITY)
Admission: RE | Admit: 2022-05-18 | Discharge: 2022-05-18 | Disposition: A | Discharge status: Home / Self Care | Payer: No Typology Code available for payment source | Source: Ambulatory Visit | Attending: Family Medicine | Admitting: Family Medicine

## 2022-05-18 VITALS — BP 153/92 | HR 73 | Wt 126.4 lb

## 2022-05-18 DIAGNOSIS — R87619 Unspecified abnormal cytological findings in specimens from cervix uteri: Secondary | ICD-10-CM

## 2022-05-18 DIAGNOSIS — Z3202 Encounter for pregnancy test, result negative: Secondary | ICD-10-CM

## 2022-05-18 LAB — POCT PREGNANCY, URINE: Preg Test, Ur: NEGATIVE

## 2022-05-18 MED ORDER — IBUPROFEN 800 MG PO TABS: 800.0000 mg | ORAL_TABLET | Freq: Once | ORAL | Status: DC

## 2022-05-18 NOTE — Progress Notes (Signed)
   Subjective:    Patient ID: Martha Burke is a 61 y.o. female presenting with Endometrial Biopsy  on 05/18/2022  HPI: Here today for EMB after finding of AGC, NOS on pap with negative HPV. Pathology favored endocervical.  Review of Systems  Constitutional:  Negative for chills and fever.  Respiratory:  Negative for shortness of breath.   Cardiovascular:  Negative for chest pain.  Gastrointestinal:  Negative for abdominal pain, nausea and vomiting.  Genitourinary:  Negative for dysuria.  Skin:  Negative for rash.      Objective:    BP (!) 153/92   Pulse 73   Wt 126 lb 6.4 oz (57.3 kg)   BMI 20.40 kg/m  Physical Exam Exam conducted with a chaperone present.  Constitutional:      General: She is not in acute distress.    Appearance: She is well-developed.  HENT:     Head: Normocephalic and atraumatic.  Eyes:     General: No scleral icterus. Cardiovascular:     Rate and Rhythm: Normal rate.  Pulmonary:     Effort: Pulmonary effort is normal.  Abdominal:     Palpations: Abdomen is soft.  Musculoskeletal:     Cervical back: Neck supple.  Skin:    General: Skin is warm and dry.  Neurological:     Mental Status: She is alert and oriented to person, place, and time.    Procedure: Patient given informed consent, signed copy in the chart, time out was performed. Appropriate time out taken. . The patient was placed in the lithotomy position and the cervix brought into view with sterile speculum.ECC performed  Procedure: Portio of cervix cleansed x 2 with betadine swabs.  A tenaculum was placed in the anterior lip of the cervix.  The uterus was sounded for depth of 7 cm. A pipelle was introduced to into the uterus, suction created,  and an endometrial sample was obtained. All equipment was removed and accounted for.  The patient tolerated the procedure well.      Assessment & Plan:   Problem List Items Addressed This Visit       Unprioritized   Atypical glandular  cells on cervical Pap smear - Primary    S/p ECC and EMB--treatment per findings.      Relevant Orders   Surgical pathology( [facility]/ [facility])   Surgical pathology( [facility]/ [facility])      Reva Bores, MD 05/18/2022 3:34 PM

## 2022-05-18 NOTE — Assessment & Plan Note (Signed)
S/p ECC and EMB--treatment per findings.

## 2022-05-18 NOTE — Addendum Note (Signed)
Addended byVidal Schwalbe on: 05/18/2022 05:27 PM   Modules accepted: Orders

## 2022-05-23 LAB — SURGICAL PATHOLOGY

## 2022-07-27 ENCOUNTER — Other Ambulatory Visit: Payer: Self-pay

## 2022-07-27 ENCOUNTER — Other Ambulatory Visit (HOSPITAL_COMMUNITY)
Admission: RE | Admit: 2022-07-27 | Discharge: 2022-07-27 | Disposition: A | Discharge status: Home / Self Care | Payer: 59 | Source: Ambulatory Visit | Attending: Family Medicine | Admitting: Family Medicine

## 2022-07-27 ENCOUNTER — Ambulatory Visit (INDEPENDENT_AMBULATORY_CARE_PROVIDER_SITE_OTHER): Payer: Self-pay | Admitting: Family Medicine

## 2022-07-27 ENCOUNTER — Encounter: Payer: Self-pay | Admitting: Family Medicine

## 2022-07-27 VITALS — BP 130/95 | HR 79 | Ht 66.0 in

## 2022-07-27 DIAGNOSIS — R87612 Low grade squamous intraepithelial lesion on cytologic smear of cervix (LGSIL): Secondary | ICD-10-CM

## 2022-07-27 DIAGNOSIS — N870 Mild cervical dysplasia: Secondary | ICD-10-CM

## 2022-07-27 DIAGNOSIS — R87619 Unspecified abnormal cytological findings in specimens from cervix uteri: Secondary | ICD-10-CM | POA: Insufficient documentation

## 2022-07-27 DIAGNOSIS — R52 Pain, unspecified: Secondary | ICD-10-CM

## 2022-07-27 MED ORDER — ACETAMINOPHEN 500 MG PO TABS
1000.0000 mg | ORAL_TABLET | Freq: Once | ORAL | Status: AC
  Administered 2022-07-27: 1000 mg via ORAL

## 2022-07-27 NOTE — Addendum Note (Signed)
Addended by: Cinda Quest A on: 07/27/2022 12:25 PM   Modules accepted: Orders

## 2022-07-27 NOTE — Progress Notes (Signed)
    GYNECOLOGY OFFICE COLPOSCOPY PROCEDURE NOTE  61 y.o. [id number] here for colposcopy for glandular cell abnormality (AGUS) pap smear on 03/30/22. Discussed role for HPV in cervical dysplasia, need for surveillance. She is s/p ECC and EMB which were both negative.  Patient gave informed written consent, time out was performed.  Placed in lithotomy position. Cervix viewed with speculum and colposcope after application of acetic acid.   Colposcopy adequate? Yes  no visible lesions and acetowhite lesion(s) noted at posterior SCJ 4 quadrant biopsies obtained.  ECC specimen obtained. All specimens were labeled and sent to pathology.  Chaperone was present during entire procedure.  Patient was given post procedure instructions.  Will follow up pathology and manage accordingly; patient will be contacted with results and recommendations.  Routine preventative health maintenance measures emphasized.  Reva Bores, MD 07/27/2022 12:11 PM

## 2022-08-03 ENCOUNTER — Telehealth: Payer: Self-pay

## 2022-08-03 LAB — SURGICAL PATHOLOGY

## 2022-08-03 NOTE — Telephone Encounter (Signed)
-----   Message from Reva Bores, MD sent at 08/03/2022  2:52 PM EST ----- Book for leep

## 2022-08-03 NOTE — Telephone Encounter (Signed)
Called pt to discuss results. VM left. Will attempt to call at a later time.

## 2022-08-07 NOTE — Telephone Encounter (Addendum)
Returned patients call. Reviewed results from Colposcopy. Reviewed results with patient and need for LEEP to remove the abnormal cells that were seen. All questions were answered. Informed patient that will send message to front office to call patient to schedule LEEP. Patient voiced understanding.

## 2022-08-08 ENCOUNTER — Telehealth: Payer: Self-pay | Admitting: *Deleted

## 2022-08-18 NOTE — Telephone Encounter (Signed)
Attempted to contact patient regarding [facility] Medicaid application. Left message on voicemail requesting a return call.  

## 2022-09-29 ENCOUNTER — Ambulatory Visit: Payer: No Typology Code available for payment source | Admitting: Family Medicine

## 2022-11-05 ENCOUNTER — Emergency Department (HOSPITAL_BASED_OUTPATIENT_CLINIC_OR_DEPARTMENT_OTHER)
Admission: EM | Admit: 2022-11-05 | Discharge: 2022-11-05 | Disposition: A | Discharge status: Home / Self Care | Payer: 59 | Attending: Emergency Medicine | Admitting: Emergency Medicine

## 2022-11-05 ENCOUNTER — Emergency Department (HOSPITAL_BASED_OUTPATIENT_CLINIC_OR_DEPARTMENT_OTHER): Payer: 59

## 2022-11-05 ENCOUNTER — Other Ambulatory Visit: Payer: Self-pay

## 2022-11-05 DIAGNOSIS — Z79899 Other long term (current) drug therapy: Secondary | ICD-10-CM | POA: Diagnosis not present

## 2022-11-05 DIAGNOSIS — R519 Headache, unspecified: Secondary | ICD-10-CM | POA: Diagnosis present

## 2022-11-05 DIAGNOSIS — I10 Essential (primary) hypertension: Secondary | ICD-10-CM | POA: Diagnosis not present

## 2022-11-05 DIAGNOSIS — Z91010 Allergy to peanuts: Secondary | ICD-10-CM | POA: Insufficient documentation

## 2022-11-05 DIAGNOSIS — Z7901 Long term (current) use of anticoagulants: Secondary | ICD-10-CM | POA: Diagnosis not present

## 2022-11-05 DIAGNOSIS — Z8673 Personal history of transient ischemic attack (TIA), and cerebral infarction without residual deficits: Secondary | ICD-10-CM | POA: Insufficient documentation

## 2022-11-05 LAB — COMPREHENSIVE METABOLIC PANEL
ALT: 33 U/L (ref 0–44)
AST: 32 U/L (ref 15–41)
Albumin: 4.5 g/dL (ref 3.5–5.0)
Alkaline Phosphatase: 59 U/L (ref 38–126)
Anion gap: 5 (ref 5–15)
BUN: 16 mg/dL (ref 6–20)
CO2: 28 mmol/L (ref 22–32)
Calcium: 9.4 mg/dL (ref 8.9–10.3)
Chloride: 102 mmol/L (ref 98–111)
Creatinine, Ser: 0.87 mg/dL (ref 0.44–1.00)
GFR, Estimated: >60 mL/min (ref >60)
Glucose, Bld: 102 mg/dL — ABNORMAL HIGH (ref 70–99)
Potassium: 4 mmol/L (ref 3.5–5.1)
Sodium: 135 mmol/L (ref 135–145)
Total Bilirubin: 0.3 mg/dL (ref 0.3–1.2)
Total Protein: 7.9 g/dL (ref 6.5–8.1)

## 2022-11-05 LAB — CBC WITH DIFFERENTIAL/PLATELET
Abs Immature Granulocytes: 0.02 10*3/uL (ref 0.00–0.07)
Basophils Absolute: 0.1 10*3/uL (ref 0.0–0.1)
Basophils Relative: 1 %
Eosinophils Absolute: 0.2 10*3/uL (ref 0.0–0.5)
Eosinophils Relative: 2 %
HCT: 42 % (ref 36.0–46.0)
Hemoglobin: 14 g/dL (ref 12.0–15.0)
Immature Granulocytes: 0 %
Lymphocytes Relative: 9 %
Lymphs Abs: 0.6 10*3/uL — ABNORMAL LOW (ref 0.7–4.0)
MCH: 30.6 pg (ref 26.0–34.0)
MCHC: 33.3 g/dL (ref 30.0–36.0)
MCV: 91.9 fL (ref 80.0–100.0)
Monocytes Absolute: 0.4 10*3/uL (ref 0.1–1.0)
Monocytes Relative: 5 %
Neutro Abs: 6.2 10*3/uL (ref 1.7–7.7)
Neutrophils Relative %: 83 %
Platelets: 279 10*3/uL (ref 150–400)
RBC: 4.57 MIL/uL (ref 3.87–5.11)
RDW: 13.2 % (ref 11.5–15.5)
WBC: 7.4 10*3/uL (ref 4.0–10.5)
nRBC: 0 % (ref 0.0–0.2)

## 2022-11-05 LAB — PROTIME-INR
INR: 2.5 — ABNORMAL HIGH (ref 0.8–1.2)
Prothrombin Time: 26.4 seconds — ABNORMAL HIGH (ref 11.4–15.2)

## 2022-11-05 MED ORDER — LOSARTAN POTASSIUM 25 MG PO TABS: 25.0000 mg | ORAL_TABLET | Freq: Every day | ORAL | 1 refills | Status: DC

## 2022-11-05 NOTE — ED Triage Notes (Addendum)
Pt took BP at home today around 1400 and was 189/139; is not prescribed BP med; c/o blurry vision x 1 wk, worse today

## 2022-11-05 NOTE — ED Provider Notes (Signed)
[place] [facility] AT [facility] Provider Note   CSN: [id number] Arrival date & time: 11/05/22  1526     History  Chief Complaint  Patient presents with   Hypertension    Martha Burke is a 62 y.o. female history of antiphospholipid syndrome and previous stroke on Coumadin, here presenting with headache and hypertension.  Patient states that she has intermittent headache and blurry vision for the last week or so.  She states that she noticed some tingling in her left foot several days ago.  She states that she came back from church today and checked her blood pressure and was in the 180s.  She also has worsening blurry vision at that time.  She is not on any blood pressure medications.  Patient denies chest pain or shortness of breath.  Patient states that she is compliant with her Coumadin  The history is provided by the patient.       Home Medications Prior to Admission medications   Medication Sig Start Date End Date Taking? Authorizing Provider  acetaminophen (TYLENOL) 500 MG tablet Take 500 mg by mouth every 6 (six) hours as needed.    [provider]  BIOTIN PO Take by mouth.    [provider]  COLLAGEN PO Use    [provider]  folic acid (FOLVITE) 1 MG tablet Take 1 mg by mouth daily. 06/26/17   [provider]  LANSOPRAZOLE PO Take 30 mg by mouth as needed.    [provider]  pravastatin (PRAVACHOL) 20 MG tablet Take 20 mg by mouth daily. 01/23/22   [provider]  warfarin (COUMADIN) 5 MG tablet Take 5 mg by mouth. As directed by coumadin clinic 04/09/17 07/08/17  [provider]      Allergies    Celery oil, Peanut oil, and Tomato    Review of Systems   Review of Systems  Neurological:  Positive for headaches.  All other systems reviewed and are negative.   Physical Exam Updated Vital Signs BP (!) 156/92   Pulse 68   Temp 98.4 F (36.9 C)   Resp (!) 21   Ht 5' 6"$   (1.676 m)   Wt 56.7 kg   SpO2 99%   BMI 20.18 kg/m  Physical Exam Vitals and nursing note reviewed.  Constitutional:      Appearance: Normal appearance.  HENT:     Head: Normocephalic.     Nose: Nose normal.     Mouth/Throat:     Mouth: Mucous membranes are moist.  Eyes:     Extraocular Movements: Extraocular movements intact.     Pupils: Pupils are equal, round, and reactive to light.  Cardiovascular:     Rate and Rhythm: Normal rate and regular rhythm.     Pulses: Normal pulses.     Heart sounds: Normal heart sounds.  Pulmonary:     Effort: Pulmonary effort is normal.     Breath sounds: Normal breath sounds.  Abdominal:     General: Abdomen is flat.     Palpations: Abdomen is soft.  Musculoskeletal:        General: Normal range of motion.     Cervical back: Normal range of motion and neck supple.  Skin:    General: Skin is warm.     Capillary Refill: Capillary refill takes less than 2 seconds.  Neurological:     General: No focal deficit present.     Mental Status: She is alert and oriented  to person, place, and time.     Comments: Cranial nerves II through XII intact.  Patient has normal strength and sensation bilaterally.  Psychiatric:        Mood and Affect: Mood normal.        Behavior: Behavior normal.     ED Results / Procedures / Treatments   Labs (all labs ordered are listed, but only abnormal results are displayed) Labs Reviewed  CBC WITH DIFFERENTIAL/PLATELET - Abnormal; Notable for the following components:      Result Value   Lymphs Abs 0.6 (*)    All other components within normal limits  COMPREHENSIVE METABOLIC PANEL - Abnormal; Notable for the following components:   Glucose, Bld 102 (*)    All other components within normal limits  PROTIME-INR  CBG MONITORING, ED    EKG EKG Interpretation  Date/Time:  Sunday November 05 2022 16:05:09 EST Ventricular Rate:  83 PR Interval:  145 QRS Duration: 82 QT Interval:  352 QTC  Calculation: 414 R Axis:   65 Text Interpretation: Sinus rhythm Probable left atrial enlargement No significant change since last tracing Confirmed by Wandra Arthurs [phone]) on 11/05/2022 5:17:40 PM  Radiology CT Head Wo Contrast  Result Date: 11/05/2022 CLINICAL DATA:  Headache.  New onset blurred vision EXAM: CT HEAD WITHOUT CONTRAST TECHNIQUE: Contiguous axial images were obtained from the base of the skull through the vertex without intravenous contrast. RADIATION DOSE REDUCTION: This exam was performed according to the departmental dose-optimization program which includes automated exposure control, adjustment of the mA and/or kV according to patient size and/or use of iterative reconstruction technique. COMPARISON:  None Available. FINDINGS: Brain: No acute intracranial hemorrhage. No focal mass lesion. No CT evidence of acute infarction. No midline shift or mass effect. No hydrocephalus. Basilar cisterns are patent. Vascular: No hyperdense vessel or unexpected calcification. Skull: Normal. Negative for fracture or focal lesion. Sinuses/Orbits: Paranasal sinuses and mastoid air cells are clear. Orbits are clear. Other: None. IMPRESSION: No acute intracranial findings. Electronically Signed   By: Suzy Bouchard M.D.   On: 11/05/2022 17:54    Procedures Procedures    Medications Ordered in ED Medications - No data to display  ED Course/ Medical Decision Making/ A&P                             Medical Decision Making Martha Burke is a 62 y.o. female here presenting with headache and hypertension.  I think she likely has symptomatic hypertension.  Patient is on Coumadin already so we will get a CT head to rule out bleed.  Will also get CBC and CMP and PT/INR.  6:25 PM I reviewed patient's labs and independently interpreted CT head.  Labs showed normal CBC and CMP.  INR is 2.5.  CT head did not show any bleed.  Patient's BP is down to the 140s to 150s with no intervention.  I wonder if she  has symptomatic hypertension.  I will start her on low-dose losartan.  She has PCP follow-up.  Problems Addressed: Hypertension, unspecified type: acute illness or injury Nonintractable headache, unspecified chronicity pattern, unspecified headache type: acute illness or injury  Amount and/or Complexity of Data Reviewed Labs: ordered. Decision-making details documented in ED Course. Radiology: ordered and independent interpretation performed. Decision-making details documented in ED Course.    Final Clinical Impression(s) / ED Diagnoses Final diagnoses:  None    Rx / DC Orders ED Discharge Orders  None         Drenda Freeze, MD 11/05/22 [phone]

## 2022-11-05 NOTE — Discharge Instructions (Signed)
Take losartan 25 mg daily.  As we discussed you need to keep a detailed log of your blood pressure at home and follow-up with your doctor in a week.  Return to ER if you have worse headache, vomiting.

## 2022-12-22 NOTE — Progress Notes (Unsigned)
Cardiology Office Note:   Date:  12/25/2022  NAME:  Martha Burke    MRN: [id number] DOB:  [birth year]   PCP:  Camie Patience, FNP  Cardiologist:  None  Electrophysiologist:  None   Referring MD: Camie Patience, FNP   Chief Complaint  Patient presents with   Palpitations   History of Present Illness:   Martha Burke is a 62 y.o. female with a hx of antiphospholipid syndrome, stroke who is being seen today for the evaluation of palpitations at the request of Camie Patience, FNP.   She was seen in the emergency room in February.  Reports elevated blood pressure and elevated heart rate.  She felt her heart racing.  She was diagnosed with hypertension and started on losartan.  Blood pressure has been stable.  She had 1 episode of heart racing.  Brief period lasted seconds.  Symptoms seem to have improved.  She reports no chest pain or trouble breathing.  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  She does have a strong family history of heart disease.  Reports issues in her grandparents and father.  Father had a heart attack very young.  She does have antiphospholipid syndrome.  She is on Coumadin.  Seems to be well-controlled.  She had a stroke as a complication of this.  She is without any cardiac complaints.  Her EKG in the emergency room was negative.  CV exam is normal.  She does not smoke.  No alcohol or drug use is reported.  She is married.  She has 4 children.  She has 4 grandchildren.  Tchol 178, HDL 57, LDL 103, TG 96  Problem List Antiphospholipid syndrome Stroke -2015 3. HTN  Past Medical History: Past Medical History:  Diagnosis Date   APS (antiphospholipid syndrome) 04/09/2017   Cerebrovascular accident (CVA) due to embolism of right cerebellar artery 03/28/2017   Chest pain of unknown etiology 11/25/2014   Decreased hearing, left    Dyspnea on exertion 11/25/2014   Encounter for therapeutic drug monitoring 04/09/2017   Impaired mobility and  activities of daily living 04/02/2017   Intermittent palpitations 11/25/2014   Long term (current) use of anticoagulants 04/09/2017   Migraine aura without headache    Migraine headache    Palpitations    Palpitations    Post herpetic neuralgia    Vertigo 03/27/2017    Past Surgical History: Past Surgical History:  Procedure Laterality Date   COLPOSCOPY     TONSILLECTOMY      Current Medications: Current Meds  Medication Sig   acetaminophen (TYLENOL) 500 MG tablet Take 500 mg by mouth every 6 (six) hours as needed.   BIOTIN PO Take by mouth.   COLLAGEN PO Use   folic acid (FOLVITE) 1 MG tablet Take 1 mg by mouth daily.   losartan (COZAAR) 25 MG tablet Take 1 tablet (25 mg total) by mouth daily.   Multiple Vitamins-Minerals (MULTIVITAMIN ADULTS 50+ PO) Take 1 tablet by mouth daily.   pravastatin (PRAVACHOL) 20 MG tablet Take 20 mg by mouth daily.     Allergies:    Celery oil, Peanut oil, and Tomato   Social History: Social History   Socioeconomic History   Marital status: Married    Spouse name: Not on file   Number of children: 4   Years of education: Not on file   Highest education level: Bachelor's degree (e.g., BA, AB, BS)  Occupational History   Occupation: Coffee  Shop Owner  Tobacco Use   Smoking status: Never   Smokeless tobacco: Never  Vaping Use   Vaping Use: Never used  Substance and Sexual Activity   Alcohol use: No    Alcohol/week: 0.0 standard drinks of alcohol   Drug use: No   Sexual activity: Yes    Birth control/protection: Post-menopausal  Other Topics Concern   Not on file  Social History Narrative   Not on file   Social Determinants of Health   Financial Resource Strain: Not on file  Food Insecurity: No Food Insecurity (05/19/2022)   Hunger Vital Sign    Worried About Running Out of Food in the Last Year: Never true    Ran Out of Food in the Last Year: Never true  Transportation Needs: No Transportation Needs (05/19/2022)   PRAPARE -  Administrator, Civil ServiceTransportation    Lack of Transportation (Medical): No    Lack of Transportation (Non-Medical): No  Physical Activity: Not on file  Stress: Not on file  Social Connections: Not on file     Family History: The patient's family history includes Atrial fibrillation in her mother; Brain cancer in her son; CAD in her father; Diabetes in her daughter; Heart attack in her paternal grandfather and paternal grandmother; Heart attack (age of onset: 6030) in her father; Hypertension in her brother, brother, and father; Irregular heart beat in her mother; Stroke in her maternal grandmother.  ROS:   All other ROS reviewed and negative. Pertinent positives noted in the HPI.     EKGs/Labs/Other Studies Reviewed:   The following studies were personally reviewed by me today:  EKG:  EKG reviewed from the emergency room demonstrates normal sinus rhythm with no acute ischemic changes or infarction  Recent Labs: 11/05/2022: ALT 33; BUN 16; Creatinine, Ser 0.87; Hemoglobin 14.0; Platelets 279; Potassium 4.0; Sodium 135   Recent Lipid Panel No results found for: "CHOL", "TRIG", "HDL", "CHOLHDL", "VLDL", "LDLCALC", "LDLDIRECT"  Physical Exam:   VS:  BP 120/74 (BP Location: Left Arm, Patient Position: Sitting, Cuff Size: Normal)   Pulse 70   Ht 5\' 6"  (1.676 m)   Wt 129 lb (58.5 kg)   BMI 20.82 kg/m    Wt Readings from Last 3 Encounters:  12/25/22 129 lb (58.5 kg)  11/05/22 125 lb (56.7 kg)  05/18/22 126 lb 6.4 oz (57.3 kg)    General: Well nourished, well developed, in no acute distress Head: Atraumatic, normal size  Eyes: PEERLA, EOMI  Neck: Supple, no JVD Endocrine: No thryomegaly Cardiac: Normal S1, S2; RRR; no murmurs, rubs, or gallops Lungs: Clear to auscultation bilaterally, no wheezing, rhonchi or rales  Abd: Soft, nontender, no hepatomegaly  Ext: No edema, pulses 2+ Musculoskeletal: No deformities, BUE and BLE strength normal and equal Skin: Warm and dry, no rashes   Neuro: Alert and  oriented to person, place, time, and situation, CNII-XII grossly intact, no focal deficits  Psych: Normal mood and affect   ASSESSMENT:   Martha Burke is a 62 y.o. female who presents for the following: 1. Palpitations   2. Mixed hyperlipidemia   3. Primary hypertension   4. Family history of heart disease     PLAN:   1. Palpitations -Unclear etiology.  Could have been related to diagnosis of hypertension.  Symptoms have improved on losartan.  No further episodes of palpitations.  We discussed she needs a TSH today.  Will plan for watchful waiting.  Symptoms seem to have largely resolved.  No need for an echo.  EKG in the emergency room was normal.  CV exam normal.  2. Mixed hyperlipidemia -Strong family history of heart disease.  On pravastatin.  LDL 103.  We discussed calcium scoring for further risk stratification.  She is okay to do this.  3. Primary hypertension -Controlled on losartan.  4. Family history of heart disease -Calcium scoring today.     Disposition: Return if symptoms worsen or fail to improve.  Medication Adjustments/Labs and Tests Ordered: Current medicines are reviewed at length with the patient today.  Concerns regarding medicines are outlined above.  Orders Placed This Encounter  Procedures   CT CARDIAC SCORING (SELF PAY ONLY)   TSH   No orders of the defined types were placed in this encounter.   Patient Instructions  Medication Instructions:  The current medical regimen is effective;  continue present plan and medications.  *If you need a refill on your cardiac medications before your next appointment, please call your pharmacy*   Lab Work: TSH today   If you have labs (blood work) drawn today and your tests are completely normal, you will receive your results only by: MyChart Message (if you have MyChart) OR A paper copy in the mail If you have any lab test that is abnormal or we need to change your treatment, we will call you to review  the results.   Testing/Procedures: CT coronary calcium score.   [facility]  [facility]   This is $99 out of pocket.   Coronary CalciumScan A coronary calcium scan is an imaging test used to look for deposits of calcium and other fatty materials (plaques) in the inner lining of the blood vessels of the heart (coronary arteries). These deposits of calcium and plaques can partly clog and narrow the coronary arteries without producing any symptoms or warning signs. This puts a person at risk for a heart attack. This test can detect these deposits before symptoms develop. Tell a health care provider about: Any allergies you have. All medicines you are taking, including vitamins, herbs, eye drops, creams, and over-the-counter medicines. Any problems you or family members have had with anesthetic medicines. Any blood disorders you have. Any surgeries you have had. Any medical conditions you have. Whether you are pregnant or may be pregnant. What are the risks? Generally, this is a safe procedure. However, problems may occur, including: Harm to a pregnant woman and her unborn baby. This test involves the use of radiation. Radiation exposure can be dangerous to a pregnant woman and her unborn baby. If you are pregnant, you generally should not have this procedure done. Slight increase in the risk of cancer. This is because of the radiation involved in the test. What happens before the procedure? No preparation is needed for this procedure. What happens during the procedure? You will undress and remove any jewelry around your neck or chest. You will put on a hospital gown. Sticky electrodes will be placed on your chest. The electrodes will be connected to an electrocardiogram (ECG) machine to record a tracing of the electrical activity of your heart. A CT scanner will take pictures of your heart. During this time, you will be asked to lie still and hold your  breath for 2-3 seconds while a picture of your heart is being taken. The procedure may vary among health care providers and hospitals. What happens after the procedure? You can get dressed. You can return to your normal activities. It is up to you to get the  results of your test. Ask your health care provider, or the department that is doing the test, when your results will be ready. Summary A coronary calcium scan is an imaging test used to look for deposits of calcium and other fatty materials (plaques) in the inner lining of the blood vessels of the heart (coronary arteries). Generally, this is a safe procedure. Tell your health care provider if you are pregnant or may be pregnant. No preparation is needed for this procedure. A CT scanner will take pictures of your heart. You can return to your normal activities after the scan is done. This information is not intended to replace advice given to you by your health care provider. Make sure you discuss any questions you have with your health care provider. Document Released: 03/02/2008 Document Revised: 07/24/2016 Document Reviewed: 07/24/2016 Elsevier Interactive Patient Education  2017 [employer].    Follow-Up: At [facility], you and your health needs are our priority.  As part of our continuing mission to provide you with exceptional heart care, we have created designated Provider Care Teams.  These Care Teams include your primary Cardiologist (physician) and Advanced Practice Providers (APPs -  Physician Assistants and Nurse Practitioners) who all work together to provide you with the care you need, when you need it.  We recommend signing up for the patient portal called "MyChart".  Sign up information is provided on this After Visit Summary.  MyChart is used to connect with patients for Virtual Visits (Telemedicine).  Patients are able to view lab/test results, encounter notes, upcoming appointments, etc.  Non-urgent messages  can be sent to your provider as well.   To learn more about what you can do with MyChart, go to [email or web address].    Your next appointment:   As needed  Provider:   Lennie Odor, MD      Signed, Lenna Gilford. Flora Lipps, MD, [facility]  [facility]  [address], [address] [place], [place] [address] [phone]  12/25/2022 9:47 AM

## 2022-12-25 ENCOUNTER — Encounter: Payer: Self-pay | Admitting: Cardiovascular Disease

## 2022-12-25 ENCOUNTER — Ambulatory Visit: Payer: 59 | Attending: Cardiovascular Disease | Admitting: Cardiovascular Disease

## 2022-12-25 VITALS — BP 120/74 | HR 70 | Ht 66.0 in | Wt 129.0 lb

## 2022-12-25 DIAGNOSIS — R002 Palpitations: Secondary | ICD-10-CM | POA: Diagnosis not present

## 2022-12-25 DIAGNOSIS — Z8249 Family history of ischemic heart disease and other diseases of the circulatory system: Secondary | ICD-10-CM

## 2022-12-25 DIAGNOSIS — I10 Essential (primary) hypertension: Secondary | ICD-10-CM | POA: Diagnosis not present

## 2022-12-25 DIAGNOSIS — E782 Mixed hyperlipidemia: Secondary | ICD-10-CM

## 2022-12-25 NOTE — Patient Instructions (Signed)
Medication Instructions:  The current medical regimen is effective;  continue present plan and medications.  *If you need a refill on your cardiac medications before your next appointment, please call your pharmacy*   Lab Work: TSH today   If you have labs (blood work) drawn today and your tests are completely normal, you will receive your results only by: MyChart Message (if you have MyChart) OR A paper copy in the mail If you have any lab test that is abnormal or we need to change your treatment, we will call you to review the results.   Testing/Procedures: CT coronary calcium score.   Test locations:  [facility]   This is $99 out of pocket.   Coronary CalciumScan A coronary calcium scan is an imaging test used to look for deposits of calcium and other fatty materials (plaques) in the inner lining of the blood vessels of the heart (coronary arteries). These deposits of calcium and plaques can partly clog and narrow the coronary arteries without producing any symptoms or warning signs. This puts a person at risk for a heart attack. This test can detect these deposits before symptoms develop. Tell a health care provider about: Any allergies you have. All medicines you are taking, including vitamins, herbs, eye drops, creams, and over-the-counter medicines. Any problems you or family members have had with anesthetic medicines. Any blood disorders you have. Any surgeries you have had. Any medical conditions you have. Whether you are pregnant or may be pregnant. What are the risks? Generally, this is a safe procedure. However, problems may occur, including: Harm to a pregnant woman and her unborn baby. This test involves the use of radiation. Radiation exposure can be dangerous to a pregnant woman and her unborn baby. If you are pregnant, you generally should not have this procedure done. Slight increase in the risk of cancer. This is because of the  radiation involved in the test. What happens before the procedure? No preparation is needed for this procedure. What happens during the procedure? You will undress and remove any jewelry around your neck or chest. You will put on a hospital gown. Sticky electrodes will be placed on your chest. The electrodes will be connected to an electrocardiogram (ECG) machine to record a tracing of the electrical activity of your heart. A CT scanner will take pictures of your heart. During this time, you will be asked to lie still and hold your breath for 2-3 seconds while a picture of your heart is being taken. The procedure may vary among health care providers and hospitals. What happens after the procedure? You can get dressed. You can return to your normal activities. It is up to you to get the results of your test. Ask your health care provider, or the department that is doing the test, when your results will be ready. Summary A coronary calcium scan is an imaging test used to look for deposits of calcium and other fatty materials (plaques) in the inner lining of the blood vessels of the heart (coronary arteries). Generally, this is a safe procedure. Tell your health care provider if you are pregnant or may be pregnant. No preparation is needed for this procedure. A CT scanner will take pictures of your heart. You can return to your normal activities after the scan is done. This information is not intended to replace advice given to you by your health care provider. Make sure you discuss any questions you have with your health care provider.  Document Released: 03/02/2008 Document Revised: 07/24/2016 Document Reviewed: 07/24/2016 Elsevier Interactive Patient Education  2017 [employer].    Follow-Up: At [facility], you and your health needs are our priority.  As part of our continuing mission to provide you with exceptional heart care, we have created designated Provider Care Teams.   These Care Teams include your primary Cardiologist (physician) and Advanced Practice Providers (APPs -  Physician Assistants and Nurse Practitioners) who all work together to provide you with the care you need, when you need it.  We recommend signing up for the patient portal called "MyChart".  Sign up information is provided on this After Visit Summary.  MyChart is used to connect with patients for Virtual Visits (Telemedicine).  Patients are able to view lab/test results, encounter notes, upcoming appointments, etc.  Non-urgent messages can be sent to your provider as well.   To learn more about what you can do with MyChart, go to [email or web address].    Your next appointment:   As needed  Provider:   Lennie Odor, MD

## 2022-12-26 LAB — TSH: TSH: 3.27 u[IU]/mL (ref 0.450–4.500)

## 2023-01-24 ENCOUNTER — Other Ambulatory Visit (HOSPITAL_BASED_OUTPATIENT_CLINIC_OR_DEPARTMENT_OTHER): Payer: No Typology Code available for payment source

## 2024-08-11 ENCOUNTER — Encounter (HOSPITAL_BASED_OUTPATIENT_CLINIC_OR_DEPARTMENT_OTHER): Payer: Self-pay

## 2024-08-11 ENCOUNTER — Other Ambulatory Visit: Payer: Self-pay

## 2024-08-11 ENCOUNTER — Emergency Department (HOSPITAL_BASED_OUTPATIENT_CLINIC_OR_DEPARTMENT_OTHER)
Admission: EM | Admit: 2024-08-11 | Discharge: 2024-08-11 | Disposition: A | Discharge status: Home / Self Care | Attending: Emergency Medicine | Admitting: Emergency Medicine

## 2024-08-11 ENCOUNTER — Emergency Department (HOSPITAL_BASED_OUTPATIENT_CLINIC_OR_DEPARTMENT_OTHER)

## 2024-08-11 DIAGNOSIS — Z91010 Allergy to peanuts: Secondary | ICD-10-CM | POA: Diagnosis not present

## 2024-08-11 DIAGNOSIS — I7121 Aneurysm of the ascending aorta, without rupture: Secondary | ICD-10-CM | POA: Insufficient documentation

## 2024-08-11 DIAGNOSIS — R072 Precordial pain: Secondary | ICD-10-CM

## 2024-08-11 DIAGNOSIS — R079 Chest pain, unspecified: Secondary | ICD-10-CM | POA: Diagnosis present

## 2024-08-11 DIAGNOSIS — I10 Essential (primary) hypertension: Secondary | ICD-10-CM | POA: Diagnosis not present

## 2024-08-11 LAB — HEPATIC FUNCTION PANEL
ALT: 25 U/L (ref 0–44)
AST: 28 U/L (ref 15–41)
Albumin: 4.8 g/dL (ref 3.5–5.0)
Alkaline Phosphatase: 66 U/L (ref 38–126)
Bilirubin, Direct: 0.1 mg/dL (ref 0.0–0.2)
Indirect Bilirubin: 0.2 mg/dL — ABNORMAL LOW (ref 0.3–0.9)
Total Bilirubin: 0.3 mg/dL (ref 0.0–1.2)
Total Protein: 7.4 g/dL (ref 6.5–8.1)

## 2024-08-11 LAB — TROPONIN T, HIGH SENSITIVITY
Troponin T High Sensitivity: <15 ng/L (ref 0–19)
Troponin T High Sensitivity: <15 ng/L (ref 0–19)

## 2024-08-11 LAB — BASIC METABOLIC PANEL WITH GFR
Anion gap: 11 (ref 5–15)
BUN: 18 mg/dL (ref 8–23)
CO2: 26 mmol/L (ref 22–32)
Calcium: 9.8 mg/dL (ref 8.9–10.3)
Chloride: 102 mmol/L (ref 98–111)
Creatinine, Ser: 0.82 mg/dL (ref 0.44–1.00)
GFR, Estimated: >60 mL/min (ref >60)
Glucose, Bld: 112 mg/dL — ABNORMAL HIGH (ref 70–99)
Potassium: 3.6 mmol/L (ref 3.5–5.1)
Sodium: 138 mmol/L (ref 135–145)

## 2024-08-11 LAB — CBC
HCT: 40.4 % (ref 36.0–46.0)
Hemoglobin: 13.6 g/dL (ref 12.0–15.0)
MCH: 30.1 pg (ref 26.0–34.0)
MCHC: 33.7 g/dL (ref 30.0–36.0)
MCV: 89.4 fL (ref 80.0–100.0)
Platelets: 306 K/uL (ref 150–400)
RBC: 4.52 MIL/uL (ref 3.87–5.11)
RDW: 13.1 % (ref 11.5–15.5)
WBC: 12.3 K/uL — ABNORMAL HIGH (ref 4.0–10.5)
nRBC: 0 % (ref 0.0–0.2)

## 2024-08-11 LAB — PROTIME-INR
INR: 2 — ABNORMAL HIGH (ref 0.8–1.2)
Prothrombin Time: 24.1 s — ABNORMAL HIGH (ref 11.4–15.2)

## 2024-08-11 MED ORDER — NITROGLYCERIN 0.4 MG SL SUBL
0.4000 mg | SUBLINGUAL_TABLET | SUBLINGUAL | Status: DC | PRN
  Administered 2024-08-11 (×2): 0.4 mg via SUBLINGUAL
  Filled 2024-08-11: qty 1

## 2024-08-11 MED ORDER — IOHEXOL 350 MG/ML SOLN
75.0000 mL | Freq: Once | INTRAVENOUS | Status: AC | PRN
  Administered 2024-08-11: 75 mL via INTRAVENOUS

## 2024-08-11 MED ORDER — ASPIRIN 325 MG PO TBEC
325.0000 mg | DELAYED_RELEASE_TABLET | Freq: Once | ORAL | Status: AC
  Administered 2024-08-11: 325 mg via ORAL
  Filled 2024-08-11: qty 1

## 2024-08-11 NOTE — ED Triage Notes (Signed)
 Pt presents via POV c/o central chest pain and left jaw pain. Reports some SOB with exertion. Ambulatory to triage. A&O x4.

## 2024-08-11 NOTE — ED Provider Notes (Signed)
 [place] [facility] AT [facility] Provider Note   CSN: [id number] Arrival date & time: 08/11/24  0203     Patient presents with: Chest Pain   Martha Burke is a 63 y.o. female.   The history is provided by the patient and medical records.  Chest Pain Martha Burke is a 63 y.o. female who presents to the Emergency Department complaining of chest pain.  She presents to the emergency department for evaluation of central chest pain that radiates to her left jaw that started about 2 hours prior to ED arrival.  She describes it as a pressure type sensation that makes it difficult to get comfortable.  No associated fever, difficulty breathing, diaphoresis, nausea, vomiting.  She does report minimal belching.  No prior similar symptoms.  No tobacco, alcohol, drug use.  She does have a history of antiphospholipid syndrome and is on warfarin, recent INR within the week was 2.9.  Her goal is 2.5-3.5.  She also has a history of hypertension, hyperlipidemia, CVA.  She has a family history of coronary artery disease in her father.     Prior to Admission medications   Medication Sig Start Date End Date Taking? Authorizing Provider  acetaminophen  (TYLENOL ) 500 MG tablet Take 500 mg by mouth every 6 (six) hours as needed.    [provider]  BIOTIN PO Take by mouth.    [provider]  COLLAGEN PO Use    [provider]  folic acid (FOLVITE) 1 MG tablet Take 1 mg by mouth daily. 06/26/17   [provider]  losartan  (COZAAR ) 25 MG tablet Take 1 tablet (25 mg total) by mouth daily. 11/05/22   Patt Alm Macho, MD  Multiple Vitamins-Minerals (MULTIVITAMIN ADULTS 50+ PO) Take 1 tablet by mouth daily.    [provider]  pravastatin (PRAVACHOL) 20 MG tablet Take 20 mg by mouth daily. 01/23/22   [provider]  warfarin (COUMADIN) 5 MG tablet Take 5 mg by mouth. As directed by coumadin clinic 04/09/17 07/08/17  [provider]    Allergies: Celery oil, Peanut oil, and Tomato    Review of Systems  Cardiovascular:  Positive for chest pain.  All other systems reviewed and are negative.   Updated Vital Signs BP 119/82   Pulse 70   Temp 98.2 F (36.8 C) (Oral)   Resp 19   SpO2 100%   Physical Exam Vitals and nursing note reviewed.  Constitutional:      Appearance: She is well-developed.  HENT:     Head: Normocephalic and atraumatic.  Cardiovascular:     Rate and Rhythm: Normal rate and regular rhythm.     Heart sounds: No murmur heard. Pulmonary:     Effort: Pulmonary effort is normal. No respiratory distress.     Breath sounds: Normal breath sounds.  Abdominal:     Palpations: Abdomen is soft.     Tenderness: There is no abdominal tenderness. There is no guarding or rebound.  Musculoskeletal:        General: No swelling or tenderness.  Skin:    General: Skin is warm and dry.  Neurological:     Mental Status: She is alert and oriented to person, place, and time.  Psychiatric:        Behavior: Behavior normal.     (all labs ordered are listed, but only abnormal results are displayed) Labs Reviewed  BASIC METABOLIC PANEL WITH GFR - Abnormal; Notable for the following components:  Result Value   Glucose, Bld 112 (*)    All other components within normal limits  CBC - Abnormal; Notable for the following components:   WBC 12.3 (*)    All other components within normal limits  PROTIME-INR - Abnormal; Notable for the following components:   Prothrombin Time 24.1 (*)    INR 2.0 (*)    All other components within normal limits  HEPATIC FUNCTION PANEL - Abnormal; Notable for the following components:   Indirect Bilirubin 0.2 (*)    All other components within normal limits  TROPONIN T, HIGH SENSITIVITY  TROPONIN T, HIGH SENSITIVITY    EKG: EKG Interpretation Date/Time:  Monday August 11 2024 02:12:42 EST Ventricular Rate:  70 PR Interval:  163 QRS Duration:  83 QT  Interval:  413 QTC Calculation: 446 R Axis:   40  Text Interpretation: Sinus rhythm Probable left atrial enlargement Confirmed by Griselda Norris [phone]) on 08/11/2024 2:22:28 AM  Radiology: CT Angio Chest PE W/Cm &/Or Wo Cm Result Date: 08/11/2024 EXAM: CTA of the Chest with contrast for PE 08/11/2024 03:42:00 AM TECHNIQUE: CTA of the chest was performed after the administration of 75 mL of iohexol  (OMNIPAQUE ) 350 MG/ML injection. Multiplanar reformatted images are provided for review. MIP images are provided for review. Automated exposure control, iterative reconstruction, and/or weight based adjustment of the mA/kV was utilized to reduce the radiation dose to as low as reasonably achievable. COMPARISON: Chest radiographs 08/11/2024 03:42:00 AM. CLINICAL HISTORY: 63 year old female. Pulmonary embolism suspected, high probability; chest pain, history of antiphospholipid syndrome. FINDINGS: PULMONARY ARTERIES: Pulmonary arteries are adequately opacified for evaluation. No pulmonary embolism. Main pulmonary artery is normal in caliber. MEDIASTINUM: Normal heart size. The heart and pericardium demonstrate no acute abnormality. There is mild enlargement of the ascending aorta (series 8 image 63, series 12 image 174, and series 15 image 73), measuring up to 41 mm diameter. Negative for thoracic aortic dissection. Positive for aberrant origin of the right subclavian artery, normal variant. Proximal great vessels appear patent. No calcified coronary artery atherosclerosis is identified. LYMPH NODES: No mediastinal, hilar or axillary lymphadenopathy. LUNGS AND PLEURA: Symmetric and mild dependent atelectasis in both lungs. Otherwise mild subpleural lung scarring, most pronounced at the lung apices. No focal consolidation or pulmonary edema. No pleural effusion or pneumothorax. UPPER ABDOMEN: Visible proximal abdominal aorta is within normal limits. Mostly non-contrast visible upper abdominal viscera appear  negative; simple fluid density left upper pole renal cyst (no follow up imaging recommended). SOFT TISSUES AND BONES: Osteopenia. Very mild for age thoracic spine degeneration. No acute bone or soft tissue abnormality. IMPRESSION: 1. No pulmonary embolism. 2. Fusiform aneurysmal enlargement of the ascending aorta (41 mm). Negative for dissection. Recommend annual follow-up by CTA or MRI. 3. No acute pulmonary abnormality. Electronically signed by: Helayne Hurst MD 08/11/2024 03:59 AM EST RP Workstation: [id number]   DG Chest 2 View Result Date: 08/11/2024 EXAM: 2 VIEW(S) XRAY OF THE CHEST 08/11/2024 02:40:00 AM COMPARISON: 02/13/2024 CLINICAL HISTORY: Chest FINDINGS: LUNGS AND PLEURA: No focal pulmonary opacity. No pleural effusion. No pneumothorax. HEART AND MEDIASTINUM: No acute abnormality of the cardiac and mediastinal silhouettes. BONES AND SOFT TISSUES: No acute osseous abnormality. IMPRESSION: 1. No acute cardiopulmonary process . Electronically signed by: Oneil Devonshire MD 08/11/2024 02:46 AM EST RP Workstation: [id number]     Procedures   Medications Ordered in the ED  nitroGLYCERIN  (NITROSTAT ) SL tablet 0.4 mg (0.4 mg Sublingual Given 08/11/24 0230)  aspirin  EC tablet 325 mg (325 mg  Oral Given 08/11/24 0223)  iohexol  (OMNIPAQUE ) 350 MG/ML injection 75 mL (75 mLs Intravenous Contrast Given 08/11/24 0345)                                    Medical Decision Making Amount and/or Complexity of Data Reviewed Labs: ordered. Radiology: ordered.  Risk OTC drugs. Prescription drug management.   Patient with history of APS on anticoagulation, hypertension here for evaluation of chest pain, does have a pleuritic component that radiates to her jaw.  She was hypertensive at ED arrival, this did improve after aspirin  and nitroglycerin  administration.  Troponins are negative x 2 and EKG is nonischemic.  Chest x-ray is negative for acute abnormality-images personally reviewed and interpreted, agree  with radiologist interpretation.  CBC with mild leukocytosis, no evidence of acute infectious process.  INR is slightly low for her goal at 2.0.  CTA was obtained given her symptoms, which is negative for PE, dissection or pneumonia.  CT does demonstrate incidental finding of ascending thoracic aortic aneurysm.  Discussed this finding with patient.  Do not suspect that this is contributing to her symptoms today.  Discussed that this will need yearly surveillance.  Discussed unclear source of her current symptoms, suspect this may be secondary to chest wall pain.  Discussed that she may take acetaminophen .  Discussed outpatient follow-up as well as return precautions.     Final diagnoses:  Precordial pain  Aneurysm of ascending aorta without rupture    ED Discharge Orders     None          Griselda Norris, MD 08/11/24 [phone]

## 2024-08-11 NOTE — Discharge Instructions (Addendum)
 Your INR was 2.0 today.  Please follow your guidelines regarding warfarin adjustments.  You had a CT scan performed today that showed that your thoracic aorta is enlarged (41mm).  Please follow-up with your family doctor for referral for yearly imaging.  The cause of your symptoms was not identified today.  Please get rechecked if you have new or concerning symptoms.  Please call to follow-up with cardiology so you can get your coronary CT as previously planned.

## 2024-08-12 ENCOUNTER — Telehealth: Payer: Self-pay | Admitting: Cardiovascular Disease

## 2024-08-12 DIAGNOSIS — Z8249 Family history of ischemic heart disease and other diseases of the circulatory system: Secondary | ICD-10-CM

## 2024-08-12 NOTE — Telephone Encounter (Signed)
 Patient states she was supposed to have a coronary calcium score CT scan performed last year, but this did not happen due to multiple factors.  Patient would like to proceed with coronary calcium score CT, order placed. A scheduler will contact patient to setup date/time to complete test. Patient is aware of $99 OOP cost.   Patient has F/U appt with Dr. Barbaraann scheduled for 09/26/2024. She was recently seen in hospital for precordial pain and newly discovered aneurysm. Will forward to Dr. Barbaraann to review.

## 2024-08-12 NOTE — Telephone Encounter (Signed)
 Patient was recently seen in the hospital for precordial pain + aneurysm of ascending aorta without rupture. She called and scheduled a follow-up with Dr. Barbaraann, 09/26/24. She would like to know know if CT cardiac scoring--previously ordered 12/2022--can be reordered. Please advise.

## 2024-08-21 ENCOUNTER — Ambulatory Visit (HOSPITAL_BASED_OUTPATIENT_CLINIC_OR_DEPARTMENT_OTHER)
Admission: RE | Admit: 2024-08-21 | Discharge: 2024-08-21 | Disposition: A | Discharge status: Home / Self Care | Payer: Self-pay | Source: Ambulatory Visit | Attending: Cardiovascular Disease | Admitting: Cardiovascular Disease

## 2024-08-21 DIAGNOSIS — Z8249 Family history of ischemic heart disease and other diseases of the circulatory system: Secondary | ICD-10-CM

## 2024-08-23 ENCOUNTER — Ambulatory Visit: Payer: Self-pay | Admitting: Cardiovascular Disease

## 2024-09-23 NOTE — Progress Notes (Signed)
 " Cardiology Office Note:  .   Date:  09/26/2024  ID:  Martha Burke, DOB 1961/09/14, MRN [id number] PCP: Dyane Anthony RAMAN, FNP  [facility] Providers Cardiologist:  Darryle ONEIDA Decent, MD   History of Present Illness: .    Chief Complaint  Patient presents with   Follow-up    Martha Burke is a 64 y.o. female with below history who presents for follow-up.   History of Present Illness   Martha Burke is a 64 year old female with antiphospholipid syndrome and stroke who presents with chest pain.  She describes her chest pain as sharp and pressure-like, radiating to her jaw. She has experienced three episodes, with the most recent occurring last Friday. She sought emergency care for the first two episodes but not for the third. The pain is severe and persistent, with no identifiable triggers or patterns. During these episodes, she also experiences burping and pain upon deep breathing.  She has a history of antiphospholipid syndrome and stroke, for which she is on warfarin. She has not seen her hematologist in years. She is also taking losartan , which was not part of her regimen during her last visit. Her blood pressure has been elevated, typically in the upper 130s to 140s systolic and 90s diastolic, and she has not been monitoring it regularly at home.  A recent CT scan showed her aorta measured at 41 mm, which she was told was an aneurysm. She has undergone a coronary calcium scoring, which was zero, and a CT scan of her chest.  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 She has a family history of heart disease. Her symptoms have caused significant concern, particularly regarding the potential for an aneurysm. No recent cough, congestion, fever, or chills, although she has been around sick individuals.           Problem List Antiphospholipid syndrome Stroke -2015 3. HTN 4. Ascending  aorta dilation  -41 mm 08/11/2024    ROS: All other ROS reviewed and negative. Pertinent positives noted in the HPI.     Studies Reviewed: .       CT CAC = 0 08/27/2024 Physical Exam:   VS:  BP (!) 140/96   Pulse 70   Ht 5' 6 (1.676 m)   Wt 125 lb (56.7 kg)   SpO2 98%   BMI 20.18 kg/m    Wt Readings from Last 3 Encounters:  09/26/24 125 lb (56.7 kg)  12/25/22 129 lb (58.5 kg)  11/05/22 125 lb (56.7 kg)    GEN: Well nourished, well developed in no acute distress NECK: No JVD; No carotid bruits CARDIAC: RRR, no murmurs, rubs, gallops RESPIRATORY:  Clear to auscultation without rales, wheezing or rhonchi  ABDOMEN: Soft, non-tender, non-distended EXTREMITIES:  No edema; No deformity  ASSESSMENT AND PLAN: .   Assessment and Plan    Chest pain evaluation Intermittent chest pain with jaw pain and belching, not consistent with cardiac etiology. Coronary calcium score of zero indicates low coronary artery disease risk. Differential includes acid reflux and stress-related causes. - Ordered coronary CTA with contrast to evaluate heart arteries for blockages. - Administered 100 mg metoprolol  prior to coronary CTA to lower heart rate. - Ordered updated kidney panel prior to coronary CTA.  Aortic ectasia Aortic measurement at 41 mm, slightly above normal limits. No immediate concern for aneurysm. Blood pressure control is crucial. - Will repeat aortic measurement in  one year. - Emphasized importance of blood pressure control to prevent further dilation.  Hypertension Blood pressure not well controlled on losartan  25 mg. Recent readings in the upper 130s/90s. Stress and anxiety may contribute. - Increased losartan  to 50 mg daily. - Instructed to monitor blood pressure daily with a goal of less than 130/80 mmHg. - Instructed to keep a log of blood pressure readings and report if not controlled.  Antiphospholipid syndrome Managed with warfarin. No recent follow-up with  hematologist. - Continue warfarin therapy as prescribed.                Follow-up: Return in about 1 year (around 09/26/2025).  Signed, Darryle DASEN. Barbaraann, MD, [facility]  [facility]  [address] [place], [place] [address] [phone]  3:01 PM   "

## 2024-09-26 ENCOUNTER — Encounter (HOSPITAL_BASED_OUTPATIENT_CLINIC_OR_DEPARTMENT_OTHER): Payer: Self-pay | Admitting: Cardiovascular Disease

## 2024-09-26 ENCOUNTER — Ambulatory Visit (HOSPITAL_BASED_OUTPATIENT_CLINIC_OR_DEPARTMENT_OTHER): Admitting: Cardiovascular Disease

## 2024-09-26 VITALS — BP 140/96 | HR 70 | Ht 66.0 in | Wt 125.0 lb

## 2024-09-26 DIAGNOSIS — R072 Precordial pain: Secondary | ICD-10-CM | POA: Diagnosis not present

## 2024-09-26 DIAGNOSIS — I77819 Aortic ectasia, unspecified site: Secondary | ICD-10-CM

## 2024-09-26 MED ORDER — METOPROLOL TARTRATE 100 MG PO TABS: 100.0000 mg | ORAL_TABLET | Freq: Once | ORAL | 0 refills | Status: AC

## 2024-09-26 MED ORDER — LOSARTAN POTASSIUM 50 MG PO TABS: 50.0000 mg | ORAL_TABLET | Freq: Every day | ORAL | 3 refills | Status: AC

## 2024-09-26 NOTE — Patient Instructions (Addendum)
 Medication Instructions:  Your physician has recommended you make the following change in your medication:  1.) increase losartan  to 50 mg - one tablet daily for blood pressure goal of 130/80 - if you 'consistently' get reading above this, please let us  know  *If you need a refill on your cardiac medications before your next appointment, please call your pharmacy*  Lab Work: Today: bmet  Testing/Procedures: Coronary CT Angiogram -see instructions below  Follow-Up: At [facility], you and your health needs are our priority.  As part of our continuing mission to provide you with exceptional heart care, our providers are all part of one team.  This team includes your primary Cardiologist (physician) and Advanced Practice Providers or APPs (Physician Assistants and Nurse Practitioners) who all work together to provide you with the care you need, when you need it.  Your next appointment:   12 month(s)  Provider:   Darryle Decent, MD      Your cardiac CT will be scheduled at   [facility] [address]  [place], [place] [address]  Heart and Vascular Tower at [address]-- please enter the parking lot using the [address] entrance and use the FREE valet service at the patient drop-off area. Enter the building and check-in with registration on the main floor.  Please follow these instructions carefully (unless otherwise directed):  An IV will be required for this test and Nitroglycerin  will be given.   On the Night Before the Test: Be sure to Drink plenty of water. Do not consume any caffeinated/decaffeinated beverages or chocolate 12 hours prior to your test. Do not take any antihistamines 12 hours prior to your test.  On the Day of the Test: Drink plenty of water until 1 hour prior to the test. Do not eat any food 1 hour prior to test. You may take your regular medications prior to the test.  Take metoprolol  (Lopressor ) two hours  prior to test. Patients who wear a continuous glucose monitor MUST   remove the device prior to scanning. FEMALES- please wear underwire-free bra if available, avoid dresses & tight clothing      After the Test: Drink plenty of water. After receiving IV contrast, you may experience a mild flushed feeling. This is normal. On occasion, you may experience a mild rash up to 24 hours after the test. This is not dangerous. If this occurs, you can take Benadryl 25 mg, Zyrtec, Claritin, or Allegra and increase your fluid intake. (Patients taking Tikosyn should avoid Benadryl, and may take Zyrtec, Claritin, or Allegra) If you experience trouble breathing, this can be serious. If it is severe call 911 IMMEDIATELY. If it is mild, please call our office.  We will call to schedule your test 2-4 weeks out understanding that some insurance companies will need an authorization prior to the service being performed.   For more information and frequently asked questions, please visit our website : [email or web address]  For non-scheduling related questions, please contact the cardiac imaging nurse navigator should you have any questions/concerns: Cardiac Imaging Nurse Navigators Direct Office Dial: [phone]   For scheduling needs, including cancellations and rescheduling, please call Brittany, [phone].

## 2024-09-27 LAB — BASIC METABOLIC PANEL WITH GFR
BUN/Creatinine Ratio: 23 (ref 12–28)
BUN: 19 mg/dL (ref 8–27)
CO2: 24 mmol/L (ref 20–29)
Calcium: 9.4 mg/dL (ref 8.7–10.3)
Chloride: 101 mmol/L (ref 96–106)
Creatinine, Ser: 0.84 mg/dL (ref 0.57–1.00)
Glucose: 91 mg/dL (ref 70–99)
Potassium: 5.4 mmol/L — ABNORMAL HIGH (ref 3.5–5.2)
Sodium: 139 mmol/L (ref 134–144)
eGFR: 79 mL/min/1.73

## 2024-09-29 ENCOUNTER — Ambulatory Visit: Payer: Self-pay | Admitting: Cardiovascular Disease

## 2024-09-29 DIAGNOSIS — E875 Hyperkalemia: Secondary | ICD-10-CM
